# Patient Record
Sex: Female | Born: 1985 | Race: White | Hispanic: No | Marital: Married | Smoking: Current every day smoker
Health system: Southern US, Community
[De-identification: ages and names within clinical notes are randomized; demographics above are authoritative.]

## PROBLEM LIST (undated history)

## (undated) ENCOUNTER — Inpatient Hospital Stay (HOSPITAL_COMMUNITY): Payer: Self-pay

## (undated) DIAGNOSIS — O26899 Other specified pregnancy related conditions, unspecified trimester: Secondary | ICD-10-CM

## (undated) DIAGNOSIS — R12 Heartburn: Secondary | ICD-10-CM

## (undated) DIAGNOSIS — F419 Anxiety disorder, unspecified: Secondary | ICD-10-CM

## (undated) DIAGNOSIS — A630 Anogenital (venereal) warts: Secondary | ICD-10-CM

## (undated) DIAGNOSIS — E079 Disorder of thyroid, unspecified: Secondary | ICD-10-CM

## (undated) DIAGNOSIS — R102 Pelvic and perineal pain: Secondary | ICD-10-CM

## (undated) DIAGNOSIS — N390 Urinary tract infection, site not specified: Secondary | ICD-10-CM

## (undated) DIAGNOSIS — N83201 Unspecified ovarian cyst, right side: Secondary | ICD-10-CM

## (undated) DIAGNOSIS — E039 Hypothyroidism, unspecified: Secondary | ICD-10-CM

## (undated) HISTORY — DX: Pelvic and perineal pain: R10.2

## (undated) HISTORY — DX: Unspecified ovarian cyst, right side: N83.201

## (undated) HISTORY — DX: Disorder of thyroid, unspecified: E07.9

## (undated) HISTORY — PX: TOOTH EXTRACTION: SUR596

## (undated) HISTORY — DX: Urinary tract infection, site not specified: N39.0

---

## 2014-04-02 ENCOUNTER — Encounter: Payer: Self-pay | Admitting: Adult Health

## 2014-04-02 ENCOUNTER — Ambulatory Visit (INDEPENDENT_AMBULATORY_CARE_PROVIDER_SITE_OTHER): Payer: BC Managed Care – PPO | Admitting: Adult Health

## 2014-04-02 VITALS — BP 100/64 | Ht <= 58 in | Wt 135.0 lb

## 2014-04-02 DIAGNOSIS — Z3200 Encounter for pregnancy test, result unknown: Secondary | ICD-10-CM

## 2014-04-02 DIAGNOSIS — Z3201 Encounter for pregnancy test, result positive: Secondary | ICD-10-CM

## 2014-04-02 LAB — POCT URINE PREGNANCY: Preg Test, Ur: POSITIVE

## 2014-04-28 LAB — US OB TRANSVAGINAL

## 2014-04-30 ENCOUNTER — Other Ambulatory Visit: Payer: Self-pay | Admitting: Obstetrics and Gynecology

## 2014-04-30 DIAGNOSIS — O3680X0 Pregnancy with inconclusive fetal viability, not applicable or unspecified: Secondary | ICD-10-CM

## 2014-05-03 ENCOUNTER — Ambulatory Visit: Payer: BC Managed Care – PPO

## 2014-05-07 ENCOUNTER — Ambulatory Visit (INDEPENDENT_AMBULATORY_CARE_PROVIDER_SITE_OTHER): Payer: BC Managed Care – PPO | Admitting: Women's Health

## 2014-05-07 ENCOUNTER — Encounter: Payer: Self-pay | Admitting: Women's Health

## 2014-05-07 VITALS — BP 118/60 | Wt 133.5 lb

## 2014-05-07 DIAGNOSIS — Z331 Pregnant state, incidental: Secondary | ICD-10-CM

## 2014-05-07 DIAGNOSIS — Z36 Encounter for antenatal screening of mother: Secondary | ICD-10-CM

## 2014-05-07 DIAGNOSIS — O99280 Endocrine, nutritional and metabolic diseases complicating pregnancy, unspecified trimester: Secondary | ICD-10-CM

## 2014-05-07 DIAGNOSIS — O34219 Maternal care for unspecified type scar from previous cesarean delivery: Secondary | ICD-10-CM

## 2014-05-07 DIAGNOSIS — E039 Hypothyroidism, unspecified: Secondary | ICD-10-CM | POA: Insufficient documentation

## 2014-05-07 DIAGNOSIS — Z3480 Encounter for supervision of other normal pregnancy, unspecified trimester: Secondary | ICD-10-CM

## 2014-05-07 DIAGNOSIS — Z1389 Encounter for screening for other disorder: Secondary | ICD-10-CM

## 2014-05-07 LAB — CBC
HCT: 39.5 % (ref 36.0–46.0)
Hemoglobin: 13.8 g/dL (ref 12.0–15.0)
MCH: 32.3 pg (ref 26.0–34.0)
MCHC: 34.9 g/dL (ref 30.0–36.0)
MCV: 92.5 fL (ref 78.0–100.0)
Platelets: 362 10*3/uL (ref 150–400)
RBC: 4.27 MIL/uL (ref 3.87–5.11)
RDW: 13.1 % (ref 11.5–15.5)
WBC: 9.6 10*3/uL (ref 4.0–10.5)

## 2014-05-07 LAB — POCT URINALYSIS DIPSTICK
Blood, UA: NEGATIVE
Glucose, UA: NEGATIVE
KETONES UA: NEGATIVE
Leukocytes, UA: NEGATIVE
Nitrite, UA: NEGATIVE
PROTEIN UA: NEGATIVE

## 2014-05-07 NOTE — Progress Notes (Signed)
  Subjective:  Chloe [name] is a 28 y.o. G50P1001 Caucasian female at [redacted]w[redacted]d by 9wk u/s, being seen today for her first obstetrical visit.  Her obstetrical history is significant for previous smoker- quit 2wks ago, prev c/s for NRFHR at 5cm w/spontaneous labor, hypothyroidism- on synthroid daily.  Pregnancy history fully reviewed.  Patient reports nausea and declines need for antiemetics. Denies vb, cramping, uti s/s, abnormal/malodorous vag d/c, or vulvovaginal itching/irritation.  BP 118/60  Wt 133 lb 8 oz (60.555 kg)  LMP 03/08/2014  HISTORY: OB History  Gravida Para Term Preterm AB SAB TAB Ectopic Multiple Living  2 1 1       1     # Outcome Date GA Lbr Len/2nd Weight Sex Delivery Anes PTL Lv  2 CUR           1 TRM 01/31/13 [redacted]w[redacted]d  6 lb 1 oz (2.75 kg) F CS   Y     Past Medical History  Diagnosis Date  . Thyroid disease    Past Surgical History  Procedure Laterality Date  . Cesarean section     Family History  Problem Relation Age of Onset  . Diabetes Paternal Grandfather   . Diabetes Paternal Grandmother   . Epilepsy Father   . Breast cancer Mother   . Thyroid disease Mother     Exam   System:     General: Well developed & nourished, no acute distress   Skin: Warm & dry, normal coloration and turgor, no rashes   Neurologic: Alert & oriented, normal mood   Cardiovascular: Regular rate & rhythm   Respiratory: Effort & rate normal, LCTAB, acyanotic   Abdomen: Soft, non tender   Extremities: normal strength, tone   Pelvic Exam:    Perineum: Normal perineum   Vulva: Normal, no lesions   Vagina:  Normal mucosa, normal discharge   Cervix: Normal, bulbous, appears closed   Uterus: Normal size/shape/contour for GA   Thin prep pap smear April 2015 in [place] at [facility]- thinks was normal  FHR: 160 via informal transabdominal u/s   Assessment:   Pregnancy: G2P1001 Patient Active Problem List   Diagnosis Date Noted  . Supervision of other normal pregnancy 05/07/2014     Priority: High  . Hypothyroidism complicating pregnancy 05/07/2014    Priority: High  . Previous cesarean section complicating pregnancy 05/07/2014    Priority: High    [redacted]w[redacted]d G2P1001 New OB visit Previous smoker- now quit Prev c/s for [facility] Hyothyroidism N/V of pregnancy   Plan:  Initial labs drawn including TSH Continue prenatal vitamins Problem list reviewed and updated Reviewed n/v relief measures and warning s/s to report Reviewed recommended weight gain based on pre-gravid BMI Encouraged well-balanced diet Genetic Screening discussed Integrated Screen: requested Cystic fibrosis screening discussed declined Ultrasound discussed; fetal survey: requested Follow up in 3 weeks for 1st it/nt and visit [facility] completed Continue synthroid Offered VBAC, consent given to take home and review, although she is pretty sure she wants a scheduled RLTCS  Marge Duncans CNM, [facility] 05/07/2014 11:25 AM

## 2014-05-07 NOTE — Patient Instructions (Signed)
Nausea & Vomiting  Have saltine crackers or pretzels by your bed and eat a few bites before you raise your head out of bed in the morning  Eat small frequent meals throughout the day instead of large meals  Drink plenty of fluids throughout the day to stay hydrated, just don't drink a lot of fluids with your meals.  This can make your stomach fill up faster making you feel sick  Do not brush your teeth right after you eat  Products with real ginger are good for nausea, like ginger ale and ginger hard candy Make sure it says made with real ginger!  Sucking on sour candy like lemon heads is also good for nausea  If your prenatal vitamins make you nauseated, take them at night so you will sleep through the nausea  If you feel like you need medicine for the nausea & vomiting please let us know  If you are unable to keep any fluids or food down please let us know    Pregnancy - First Trimester During sexual intercourse, millions of sperm go into the vagina. Only 1 sperm will penetrate and fertilize the female egg while it is in the Fallopian tube. One week later, the fertilized egg implants into the wall of the uterus. An embryo begins to develop into a baby. At 6 to 8 weeks, the eyes and face are formed and the heartbeat can be seen on ultrasound. At the end of 12 weeks (first trimester), all the baby's organs are formed. Now that you are pregnant, you will want to do everything you can to have a healthy baby. Two of the most important things are to get good prenatal care and follow your caregiver's instructions. Prenatal care is all the medical care you receive before the baby's birth. It is given to prevent, find, and treat problems during the pregnancy and childbirth. PRENATAL EXAMS  During prenatal visits, your weight, blood pressure, and urine are checked. This is done to make sure you are healthy and progressing normally during the pregnancy.  A pregnant woman should gain 25 to 35 pounds  during the pregnancy. However, if you are overweight or underweight, your caregiver will advise you regarding your weight.  Your caregiver will ask and answer questions for you.  Blood work, cervical cultures, other necessary tests, and a Pap test are done during your prenatal exams. These tests are done to check on your health and the probable health of your baby. Tests are strongly recommended and done for HIV with your permission. This is the virus that causes AIDS. These tests are done because medicines can be given to help prevent your baby from being born with this infection should you have been infected without knowing it. Blood work is also used to find out your blood type, previous infections, and follow your blood levels (hemoglobin).  Low hemoglobin (anemia) is common during pregnancy. Iron and vitamins are given to help prevent this. Later in the pregnancy, blood tests for diabetes will be done along with any other tests if any problems develop.  You may need other tests to make sure you and the baby are doing well. CHANGES DURING THE FIRST TRIMESTER  Your body goes through many changes during pregnancy. They vary from person to person. Talk to your caregiver about changes you notice and are concerned about. Changes can include:  Your menstrual period stops.  The egg and sperm carry the genes that determine what you look like. Genes from you   and your partner are forming a baby. The female genes determine whether the baby is a boy or a girl.  Your body increases in girth and you may feel bloated.  Feeling sick to your stomach (nauseous) and throwing up (vomiting). If the vomiting is uncontrollable, call your caregiver.  Your breasts will begin to enlarge and become tender.  Your nipples may stick out more and become darker.  The need to urinate more. Painful urination may mean you have a bladder infection.  Tiring easily.  Loss of appetite.  Cravings for certain kinds of  food.  At first, you may gain or lose a couple of pounds.  You may have changes in your emotions from day to day (excited to be pregnant or concerned something may go wrong with the pregnancy and baby).  You may have more vivid and strange dreams. HOME CARE INSTRUCTIONS   It is very important to avoid all smoking, alcohol and non-prescribed drugs during your pregnancy. These affect the formation and growth of the baby. Avoid chemicals while pregnant to ensure the delivery of a healthy infant.  Start your prenatal visits by the 12th week of pregnancy. They are usually scheduled monthly at first, then more often in the last 2 months before delivery. Keep your caregiver's appointments. Follow your caregiver's instructions regarding medicine use, blood and lab tests, exercise, and diet.  During pregnancy, you are providing food for you and your baby. Eat regular, well-balanced meals. Choose foods such as meat, fish, milk and other low fat dairy products, vegetables, fruits, and whole-grain breads and cereals. Your caregiver will tell you of the ideal weight gain.  You can help morning sickness by keeping soda crackers at the bedside. Eat a couple before arising in the morning. You may want to use the crackers without salt on them.  Eating 4 to 5 small meals rather than 3 large meals a day also may help the nausea and vomiting.  Drinking liquids between meals instead of during meals also seems to help nausea and vomiting.  A physical sexual relationship may be continued throughout pregnancy if there are no other problems. Problems may be early (premature) leaking of amniotic fluid from the membranes, vaginal bleeding, or belly (abdominal) pain.  Exercise regularly if there are no restrictions. Check with your caregiver or physical therapist if you are unsure of the safety of some of your exercises. Greater weight gain will occur in the last 2 trimesters of pregnancy. Exercising will  help:  Control your weight.  Keep you in shape.  Prepare you for labor and delivery.  Help you lose your pregnancy weight after you deliver your baby.  Wear a good support or jogging bra for breast tenderness during pregnancy. This may help if worn during sleep too.  Ask when prenatal classes are available. Begin classes when they are offered.  Do not use hot tubs, steam rooms, or saunas.  Wear your seat belt when driving. This protects you and your baby if you are in an accident.  Avoid raw meat, uncooked cheese, cat litter boxes, and soil used by cats throughout the pregnancy. These carry germs that can cause birth defects in the baby.  The first trimester is a good time to visit your dentist for your dental health. Getting your teeth cleaned is okay. Use a softer toothbrush and brush gently during pregnancy.  Ask for help if you have financial, counseling, or nutritional needs during pregnancy. Your caregiver will be able to offer counseling for   these needs as well as refer you for other special needs.  Do not take any medicines or herbs unless told by your caregiver.  Inform your caregiver if there is any mental or physical domestic violence.  Make a list of emergency phone numbers of family, friends, hospital, and police and fire departments.  Write down your questions. Take them to your prenatal visit.  Do not douche.  Do not cross your legs.  If you have to stand for long periods of time, rotate you feet or take small steps in a circle.  You may have more vaginal secretions that may require a sanitary pad. Do not use tampons or scented sanitary pads. MEDICINES AND DRUG USE IN PREGNANCY  Take prenatal vitamins as directed. The vitamin should contain 1 milligram of folic acid. Keep all vitamins out of reach of children. Only a couple vitamins or tablets containing iron may be fatal to a baby or young child when ingested.  Avoid use of all medicines, including herbs,  over-the-counter medicines, not prescribed or suggested by your caregiver. Only take over-the-counter or prescription medicines for pain, discomfort, or fever as directed by your caregiver. Do not use aspirin, ibuprofen, or naproxen unless directed by your caregiver.  Let your caregiver also know about herbs you may be using.  Alcohol is related to a number of birth defects. This includes fetal alcohol syndrome. All alcohol, in any form, should be avoided completely. Smoking will cause low birth rate and premature babies.  Street or illegal drugs are very harmful to the baby. They are absolutely forbidden. A baby born to an addicted mother will be addicted at birth. The baby will go through the same withdrawal an adult does.  Let your caregiver know about any medicines that you have to take and for what reason you take them. SEEK MEDICAL CARE IF:  You have any concerns or worries during your pregnancy. It is better to call with your questions if you feel they cannot wait, rather than worry about them. SEEK IMMEDIATE MEDICAL CARE IF:   An unexplained oral temperature above 102 F (38.9 C) develops, or as your caregiver suggests.  You have leaking of fluid from the vagina (birth canal). If leaking membranes are suspected, take your temperature and inform your caregiver of this when you call.  There is vaginal spotting or bleeding. Notify your caregiver of the amount and how many pads are used.  You develop a bad smelling vaginal discharge with a change in the color.  You continue to feel sick to your stomach (nauseated) and have no relief from remedies suggested. You vomit blood or coffee ground-like materials.  You lose more than 2 pounds of weight in 1 week.  You gain more than 2 pounds of weight in 1 week and you notice swelling of your face, hands, feet, or legs.  You gain 5 pounds or more in 1 week (even if you do not have swelling of your hands, face, legs, or feet).  You get  exposed to German measles and have never had them.  You are exposed to fifth disease or chickenpox.  You develop belly (abdominal) pain. Round ligament discomfort is a common non-cancerous (benign) cause of abdominal pain in pregnancy. Your caregiver still must evaluate this.  You develop headache, fever, diarrhea, pain with urination, or shortness of breath.  You fall or are in a car accident or have any kind of trauma.  There is mental or physical violence in your home. Document   Released: 11/09/2001 Document Revised: 08/09/2012 Document Reviewed: 05/13/2009 ExitCare Patient Information 2014 [employer].  

## 2014-05-08 ENCOUNTER — Encounter: Payer: Self-pay | Admitting: Women's Health

## 2014-05-08 LAB — URINALYSIS
Bilirubin Urine: NEGATIVE
GLUCOSE, UA: NEGATIVE mg/dL
Hgb urine dipstick: NEGATIVE
Ketones, ur: NEGATIVE mg/dL
LEUKOCYTES UA: NEGATIVE
NITRITE: NEGATIVE
PH: 7 (ref 5.0–8.0)
Protein, ur: NEGATIVE mg/dL
Specific Gravity, Urine: 1.01 (ref 1.005–1.030)
Urobilinogen, UA: 0.2 mg/dL (ref 0.0–1.0)

## 2014-05-08 LAB — DRUG SCREEN, URINE, NO CONFIRMATION
Amphetamine Screen, Ur: NEGATIVE
BARBITURATE QUANT UR: NEGATIVE
BENZODIAZEPINES.: NEGATIVE
Cocaine Metabolites: NEGATIVE
Creatinine,U: 104.8 mg/dL
METHADONE: NEGATIVE
Marijuana Metabolite: NEGATIVE
Opiate Screen, Urine: NEGATIVE
Phencyclidine (PCP): NEGATIVE
Propoxyphene: NEGATIVE

## 2014-05-08 LAB — GC/CHLAMYDIA PROBE AMP
CT Probe RNA: NEGATIVE
GC PROBE AMP APTIMA: NEGATIVE

## 2014-05-08 LAB — URINE CULTURE
Colony Count: NO GROWTH
ORGANISM ID, BACTERIA: NO GROWTH

## 2014-05-08 LAB — HEPATITIS B SURFACE ANTIGEN: Hepatitis B Surface Ag: NEGATIVE

## 2014-05-08 LAB — RPR

## 2014-05-08 LAB — HIV ANTIBODY (ROUTINE TESTING W REFLEX): HIV 1&2 Ab, 4th Generation: NONREACTIVE

## 2014-05-08 LAB — RUBELLA SCREEN: RUBELLA: 1.36 {index} — AB (ref <0.90)

## 2014-05-08 LAB — TSH: TSH: 1.538 u[IU]/mL (ref 0.350–4.500)

## 2014-05-08 LAB — VARICELLA ZOSTER ANTIBODY, IGG: VARICELLA IGG: 823.9 {index} — AB (ref <135.00)

## 2014-05-08 LAB — OXYCODONE SCREEN, UA, RFLX CONFIRM: OXYCODONE SCRN UR: NEGATIVE ng/mL

## 2014-05-09 LAB — ABO AND RH: Rh Type: POSITIVE

## 2014-05-09 LAB — ANTIBODY SCREEN: Antibody Screen: NEGATIVE

## 2014-05-28 ENCOUNTER — Encounter: Payer: Self-pay | Admitting: Women's Health

## 2014-05-28 ENCOUNTER — Ambulatory Visit (INDEPENDENT_AMBULATORY_CARE_PROVIDER_SITE_OTHER): Payer: BC Managed Care – PPO

## 2014-05-28 ENCOUNTER — Ambulatory Visit (INDEPENDENT_AMBULATORY_CARE_PROVIDER_SITE_OTHER): Payer: Self-pay | Admitting: Women's Health

## 2014-05-28 VITALS — BP 120/62 | Wt 133.5 lb

## 2014-05-28 DIAGNOSIS — Z36 Encounter for antenatal screening of mother: Secondary | ICD-10-CM

## 2014-05-28 DIAGNOSIS — Z3480 Encounter for supervision of other normal pregnancy, unspecified trimester: Secondary | ICD-10-CM

## 2014-05-28 DIAGNOSIS — Z331 Pregnant state, incidental: Secondary | ICD-10-CM

## 2014-05-28 DIAGNOSIS — Z1389 Encounter for screening for other disorder: Secondary | ICD-10-CM

## 2014-05-28 DIAGNOSIS — Z3481 Encounter for supervision of other normal pregnancy, first trimester: Secondary | ICD-10-CM

## 2014-05-28 DIAGNOSIS — R87610 Atypical squamous cells of undetermined significance on cytologic smear of cervix (ASC-US): Secondary | ICD-10-CM

## 2014-05-28 DIAGNOSIS — R87810 Cervical high risk human papillomavirus (HPV) DNA test positive: Secondary | ICD-10-CM

## 2014-05-28 LAB — POCT URINALYSIS DIPSTICK
Blood, UA: NEGATIVE
GLUCOSE UA: NEGATIVE
Ketones, UA: NEGATIVE
Leukocytes, UA: NEGATIVE
Nitrite, UA: NEGATIVE
Protein, UA: NEGATIVE

## 2014-05-28 NOTE — Patient Instructions (Signed)
Second Trimester of Pregnancy The second trimester is from week 13 through week 28, months 4 through 6. The second trimester is often a time when you feel your best. Your body has also adjusted to being pregnant, and you begin to feel better physically. Usually, morning sickness has lessened or quit completely, you may have more energy, and you may have an increase in appetite. The second trimester is also a time when the fetus is growing rapidly. At the end of the sixth month, the fetus is about 9 inches long and weighs about 1 pounds. You will likely begin to feel the baby move (quickening) between 18 and 20 weeks of the pregnancy. BODY CHANGES Your body goes through many changes during pregnancy. The changes vary from woman to woman.   Your weight will continue to increase. You will notice your lower abdomen bulging out.  You may begin to get stretch marks on your hips, abdomen, and breasts.  You may develop headaches that can be relieved by medicines approved by your health care provider.  You may urinate more often because the fetus is pressing on your bladder.  You may develop or continue to have heartburn as a result of your pregnancy.  You may develop constipation because certain hormones are causing the muscles that push waste through your intestines to slow down.  You may develop hemorrhoids or swollen, bulging veins (varicose veins).  You may have back pain because of the weight gain and pregnancy hormones relaxing your joints between the bones in your pelvis and as a result of a shift in weight and the muscles that support your balance.  Your breasts will continue to grow and be tender.  Your gums may bleed and may be sensitive to brushing and flossing.  Dark spots or blotches (chloasma, mask of pregnancy) may develop on your face. This will likely fade after the baby is born.  A dark line from your belly button to the pubic area (linea nigra) may appear. This will likely fade  after the baby is born.  You may have changes in your hair. These can include thickening of your hair, rapid growth, and changes in texture. Some women also have hair loss during or after pregnancy, or hair that feels dry or thin. Your hair will most likely return to normal after your baby is born. WHAT TO EXPECT AT YOUR PRENATAL VISITS During a routine prenatal visit:  You will be weighed to make sure you and the fetus are growing normally.  Your blood pressure will be taken.  Your abdomen will be measured to track your baby's growth.  The fetal heartbeat will be listened to.  Any test results from the previous visit will be discussed. Your health care provider may ask you:  How you are feeling.  If you are feeling the baby move.  If you have had any abnormal symptoms, such as leaking fluid, bleeding, severe headaches, or abdominal cramping.  If you have any questions. Other tests that may be performed during your second trimester include:  Blood tests that check for:  Low iron levels (anemia).  Gestational diabetes (between 24 and 28 weeks).  Rh antibodies.  Urine tests to check for infections, diabetes, or protein in the urine.  An ultrasound to confirm the proper growth and development of the baby.  An amniocentesis to check for possible genetic problems.  Fetal screens for spina bifida and Down syndrome. HOME CARE INSTRUCTIONS   Avoid all smoking, herbs, alcohol, and unprescribed   drugs. These chemicals affect the formation and growth of the baby.  Follow your health care provider's instructions regarding medicine use. There are medicines that are either safe or unsafe to take during pregnancy.  Exercise only as directed by your health care provider. Experiencing uterine cramps is a good sign to stop exercising.  Continue to eat regular, healthy meals.  Wear a good support bra for breast tenderness.  Do not use hot tubs, steam rooms, or saunas.  Wear your  seat belt at all times when driving.  Avoid raw meat, uncooked cheese, cat litter boxes, and soil used by cats. These carry germs that can cause birth defects in the baby.  Take your prenatal vitamins.  Try taking a stool softener (if your health care provider approves) if you develop constipation. Eat more high-fiber foods, such as fresh vegetables or fruit and whole grains. Drink plenty of fluids to keep your urine clear or pale yellow.  Take warm sitz baths to soothe any pain or discomfort caused by hemorrhoids. Use hemorrhoid cream if your health care provider approves.  If you develop varicose veins, wear support hose. Elevate your feet for 15 minutes, 3-4 times a day. Limit salt in your diet.  Avoid heavy lifting, wear low heel shoes, and practice good posture.  Rest with your legs elevated if you have leg cramps or low back pain.  Visit your dentist if you have not gone yet during your pregnancy. Use a soft toothbrush to brush your teeth and be gentle when you floss.  A sexual relationship may be continued unless your health care provider directs you otherwise.  Continue to go to all your prenatal visits as directed by your health care provider. SEEK MEDICAL CARE IF:   You have dizziness.  You have mild pelvic cramps, pelvic pressure, or nagging pain in the abdominal area.  You have persistent nausea, vomiting, or diarrhea.  You have a bad smelling vaginal discharge.  You have pain with urination. SEEK IMMEDIATE MEDICAL CARE IF:   You have a fever.  You are leaking fluid from your vagina.  You have spotting or bleeding from your vagina.  You have severe abdominal cramping or pain.  You have rapid weight gain or loss.  You have shortness of breath with chest pain.  You notice sudden or extreme swelling of your face, hands, ankles, feet, or legs.  You have not felt your baby move in over an hour.  You have severe headaches that do not go away with  medicine.  You have vision changes. Document Released: 11/09/2001 Document Revised: 11/20/2013 Document Reviewed: 01/16/2013 ExitCare Patient Information 2015 [employer]. This information is not intended to replace advice given to you by your health care provider. Make sure you discuss any questions you have with your health care provider.  

## 2014-05-28 NOTE — Progress Notes (Signed)
U/S(12+6wks)-single active fetus, FHR-155 bpm, CRL c/w dates, posterior Gr 0 placenta, cx appears closed (3.7cm), bilateral adnexa appears WNL, NB present, NT-1.6980mm

## 2014-05-28 NOTE — Progress Notes (Signed)
Low-risk OB appointment G2P1001 702w6d Estimated Date of Delivery: 12/04/14 BP 120/62  Wt 133 lb 8 oz (60.555 kg)  LMP 03/08/2014  BP, weight, and urine reviewed.  Refer to obstetrical flow sheet for FH & FHR.  Denies cramping, lof, vb, or uti s/s. No complaints. Reviewed today's nt u/s, warning s/s to report. Plan:  Continue routine obstetrical care  F/U in 4wks for 2nd IT, OB appointment & COLPO w/ MD d/t ASCUS w/ +HRHPV

## 2014-06-05 LAB — MATERNAL SCREEN, INTEGRATED #1

## 2014-06-25 ENCOUNTER — Encounter: Payer: BC Managed Care – PPO | Admitting: Obstetrics & Gynecology

## 2014-06-27 ENCOUNTER — Encounter: Payer: Self-pay | Admitting: Obstetrics & Gynecology

## 2014-06-27 ENCOUNTER — Ambulatory Visit (INDEPENDENT_AMBULATORY_CARE_PROVIDER_SITE_OTHER): Payer: Self-pay | Admitting: Obstetrics & Gynecology

## 2014-06-27 VITALS — BP 110/70 | Wt 134.0 lb

## 2014-06-27 DIAGNOSIS — Z331 Pregnant state, incidental: Secondary | ICD-10-CM

## 2014-06-27 DIAGNOSIS — Z3480 Encounter for supervision of other normal pregnancy, unspecified trimester: Secondary | ICD-10-CM

## 2014-06-27 DIAGNOSIS — R87810 Cervical high risk human papillomavirus (HPV) DNA test positive: Secondary | ICD-10-CM

## 2014-06-27 DIAGNOSIS — O34219 Maternal care for unspecified type scar from previous cesarean delivery: Secondary | ICD-10-CM

## 2014-06-27 DIAGNOSIS — R87610 Atypical squamous cells of undetermined significance on cytologic smear of cervix (ASC-US): Secondary | ICD-10-CM

## 2014-06-27 DIAGNOSIS — Z3482 Encounter for supervision of other normal pregnancy, second trimester: Secondary | ICD-10-CM

## 2014-06-27 DIAGNOSIS — Z1389 Encounter for screening for other disorder: Secondary | ICD-10-CM

## 2014-06-27 LAB — POCT URINALYSIS DIPSTICK
Blood, UA: NEGATIVE
GLUCOSE UA: NEGATIVE
KETONES UA: NEGATIVE
LEUKOCYTES UA: NEGATIVE
Nitrite, UA: NEGATIVE
Protein, UA: NEGATIVE

## 2014-06-27 NOTE — Progress Notes (Signed)
G2P1001 5874w1d Estimated Date of Delivery: 12/04/14  Blood pressure 110/70, weight 134 lb (60.782 kg), last menstrual period 03/08/2014, not currently breastfeeding.   BP weight and urine results all reviewed and noted.  Please refer to the obstetrical flow sheet for the fundal height and fetal heart rate documentation:  Patient reports good fetal movement, denies any bleeding and no rupture of membranes symptoms or regular contractions. Patient is without complaints. All questions were answered.  Plan:  Continued routine obstetrical care,   Follow up in 4 weeks for OB appointment, sonogram     Colposcopy Procedure Note:  Pap performed:  04/2014 Result:  ASCUS +HPV Smoker:  No. New sexual partner:  No.  : time frame:    History of abnormal Pap: no  Due to the indications listed above, a thorough colposcopic evaluation of the cervix and upper vagina was performed in the usual fashion using 3% acetic acid.  The findings of the visual inspection are noted below:  adequate Acetowhite changes       positive Punctation                      negative Mosaicism                      negative Abnormal Vessels          negative  Biopsy performed:          No.  Complications: no   Impression: HPV atypia  Recommendation: Follow up Colposcopy 8 weeks post delivery      Past Medical History  Diagnosis Date  . Thyroid disease     Past Surgical History  Procedure Laterality Date  . Cesarean section      OB History   Grav Para Term Preterm Abortions TAB SAB Ect Mult Living   2 1 1       1       Allergies  Allergen Reactions  . Amoxicillin Nausea And Vomiting  . Cephalosporins Hives  . Penicillins Hives    History   Social History  . Marital Status: Married    Spouse Name: N/A    Number of Children: N/A  . Years of Education: N/A   Social History Main Topics  . Smoking status: Former Smoker -- 0.50 packs/day for 8 years    Types: Cigarettes  . Smokeless  tobacco: Never Used  . Alcohol Use: No     Comment: occ.; not now  . Drug Use: No  . Sexual Activity: Yes    Birth Control/ Protection: None   Other Topics Concern  . None   Social History Narrative  . None    Family History  Problem Relation Age of Onset  . Diabetes Paternal Grandfather   . Diabetes Paternal Grandmother   . Epilepsy Father   . Breast cancer Mother   . Thyroid disease Mother

## 2014-06-27 NOTE — Addendum Note (Signed)
Addended by: Richardson ChiquitoRAVIS, Lenora Gomes M on: 06/27/2014 11:36 AM   Modules accepted: Orders

## 2014-07-02 LAB — MATERNAL SCREEN, INTEGRATED #2
AFP MOM MAT SCREEN: 0.85
AFP, Serum: 34.2 ng/mL
Calculated Gestational Age: 17
Crown Rump Length: 64.5 mm
ESTRIOL FREE MAT SCREEN: 1.17 ng/mL
ESTRIOL MOM MAT SCREEN: 1.05
INHIBIN A DIMERIC MAT SCREEN: 92 pg/mL
Inhibin A MoM: 0.52
MSS Trisomy 18 Risk: <1:5000 {titer}
NT MoM: 1.23
NUCHAL TRANSLUCENCY MAT SCREEN 2: 1.8 mm
NUMBER OF FETUSES MAT SCREEN 2: 1
PAPP-A MAT SCREEN: 418 ng/mL
PAPP-A MoM: 0.53
hCG MoM: 0.58
hCG, Serum: 17.8 IU/mL

## 2014-07-25 ENCOUNTER — Other Ambulatory Visit: Payer: BC Managed Care – PPO

## 2014-07-25 ENCOUNTER — Ambulatory Visit (INDEPENDENT_AMBULATORY_CARE_PROVIDER_SITE_OTHER): Payer: BC Managed Care – PPO | Admitting: Advanced Practice Midwife

## 2014-07-25 ENCOUNTER — Encounter: Payer: BC Managed Care – PPO | Admitting: Advanced Practice Midwife

## 2014-07-25 ENCOUNTER — Encounter: Payer: Self-pay | Admitting: Advanced Practice Midwife

## 2014-07-25 ENCOUNTER — Other Ambulatory Visit: Payer: Self-pay | Admitting: Obstetrics & Gynecology

## 2014-07-25 ENCOUNTER — Ambulatory Visit (INDEPENDENT_AMBULATORY_CARE_PROVIDER_SITE_OTHER): Payer: BC Managed Care – PPO

## 2014-07-25 VITALS — BP 106/66 | Wt 138.0 lb

## 2014-07-25 DIAGNOSIS — Z1389 Encounter for screening for other disorder: Secondary | ICD-10-CM

## 2014-07-25 DIAGNOSIS — Z3482 Encounter for supervision of other normal pregnancy, second trimester: Secondary | ICD-10-CM

## 2014-07-25 DIAGNOSIS — Z3480 Encounter for supervision of other normal pregnancy, unspecified trimester: Secondary | ICD-10-CM

## 2014-07-25 DIAGNOSIS — O34219 Maternal care for unspecified type scar from previous cesarean delivery: Secondary | ICD-10-CM

## 2014-07-25 DIAGNOSIS — Z331 Pregnant state, incidental: Secondary | ICD-10-CM

## 2014-07-25 LAB — POCT URINALYSIS DIPSTICK
GLUCOSE UA: NEGATIVE
Ketones, UA: NEGATIVE
Leukocytes, UA: NEGATIVE
Nitrite, UA: NEGATIVE
Protein, UA: NEGATIVE
RBC UA: NEGATIVE

## 2014-07-25 NOTE — Progress Notes (Signed)
Pt denies any problems or concerns at this time.  

## 2014-07-25 NOTE — Progress Notes (Signed)
[id number] [redacted]w[redacted]d Estimated Date of Delivery: 12/04/14  Blood pressure 106/66, weight 138 lb (62.596 kg), last menstrual period 03/08/2014, not currently breastfeeding.   BP weight and urine results all reviewed and noted.  Please refer to the obstetrical flow sheet for the fundal height and fetal heart rate documentation: had anatomy scan today:  Normal female  Patient reports good fetal movement, denies any bleeding and no rupture of membranes symptoms or regular contractions. Patient is without complaints. All questions were answered.  Plan:  Continued routine obstetrical care,   Follow up in 4 weeks for OB appointment,

## 2014-07-25 NOTE — Progress Notes (Signed)
U/S(21+1wks)-single active fetus, meas c/w dates, fluid wnl, posterior/fundal Gr 0 placenta, cx appears closed (3.9cm), bilateral adnexa appears WNL, FHR-159 bpm, female fetus, no major abnl noted

## 2014-08-22 ENCOUNTER — Ambulatory Visit (INDEPENDENT_AMBULATORY_CARE_PROVIDER_SITE_OTHER): Payer: BC Managed Care – PPO | Admitting: Advanced Practice Midwife

## 2014-08-22 VITALS — BP 128/80 | Wt 142.0 lb

## 2014-08-22 DIAGNOSIS — Z1389 Encounter for screening for other disorder: Secondary | ICD-10-CM

## 2014-08-22 DIAGNOSIS — R87610 Atypical squamous cells of undetermined significance on cytologic smear of cervix (ASC-US): Secondary | ICD-10-CM

## 2014-08-22 DIAGNOSIS — R87810 Cervical high risk human papillomavirus (HPV) DNA test positive: Secondary | ICD-10-CM

## 2014-08-22 DIAGNOSIS — Z331 Pregnant state, incidental: Secondary | ICD-10-CM

## 2014-08-22 DIAGNOSIS — Z3480 Encounter for supervision of other normal pregnancy, unspecified trimester: Secondary | ICD-10-CM

## 2014-08-22 DIAGNOSIS — Z3482 Encounter for supervision of other normal pregnancy, second trimester: Secondary | ICD-10-CM

## 2014-08-22 LAB — POCT URINALYSIS DIPSTICK
Blood, UA: NEGATIVE
GLUCOSE UA: NEGATIVE
KETONES UA: NEGATIVE
Leukocytes, UA: NEGATIVE
Nitrite, UA: NEGATIVE
PROTEIN UA: NEGATIVE

## 2014-08-22 NOTE — Patient Instructions (Signed)
1. Before your test, do not eat or drink anything for 8-10 hours prior to your  appointment (a small amount of water is allowed and you may take any medicines you normally take). Be sure to drink lots of water the day before. 2. When you arrive, your blood will be drawn for a 'fasting' blood sugar level.  Then you will be given a sweetened carbonated beverage to drink. You should  complete drinking this beverage within five minutes. After finishing the  beverage, you will have your blood drawn exactly 1 and 2 hours later. Having  your blood drawn on time is an important part of this test. A total of three blood  samples will be done. 3. The test takes approximately 2  hours. During the test, do not have anything to  eat or drink. Do not smoke, chew gum (not even sugarless gum) or use breath mints.  4. During the test you should remain close by and seated as much as possible and  avoid walking around. You may want to bring a book or something else to  occupy your time.  5. After your test, you may eat and drink as normal. You may want to bring a snack  to eat after the test is finished. Your provider will advise you as to the results of  this test and any follow-up if necessary  You will also be retested for syphilis, HIV and blood levels (anemia):  You were already tested in the first trimester, but [facility] recommends retesting.  Additionally, you will be tested for Type 2 Herpes. MOST people do not know that they have genital herpes, as only around 15% of people have outbreaks.  However, it is still transmittable to other people, including the baby (but only during the birth).  If you test positive for Type 2 Herpes, we place you on a medicine called acyclovir the last 6 weeks of your pregnancy to prevent transmission of the virus to the baby during the birth.    If your sugar test is positive for gestational diabetes, you will be given an phone call and further instructions discussed.   We typically do not call patients with positive herpes results, but will discuss it at your next appointment.  If you wish to know all of your test results before your next appointment, feel free to call the office, or look up your test results on Mychart.  (The range that the lab uses for normal values of the sugar test are not necessarily the range that is used for pregnant women; if your results are within the range, they are definitely normal.  However, if a value is deemed "high" by the lab, it may not be too high for a pregnant woman.  We will need to discuss the normal range if your value(s) fall in the "high" category).     Sometime between 27 and 36 weeks, it is recommended that you and anyone who is going to be in close contact with your baby receive the Tdap booster.  You should receive it EACH pregnancy, regardless of when your last booster was.  You may go to the [facility] (no appointment necessary) or your Primary Care office to receive the vaccine.  If you do not receive the vaccine prior to delivery, it will be offered in the hospital.  However, if you get it at least 2 weeks prior to delivery, you will have the added advantage of passing the immunity to your baby.   

## 2014-08-22 NOTE — Progress Notes (Signed)
G2P1001 [redacted]w[redacted]d Estimated Date of Delivery: 12/04/14  Last menstrual period 03/08/2014, not currently breastfeeding.   BP weight and urine results all reviewed and noted.  Please refer to the obstetrical flow sheet for the fundal height and fetal heart rate documentation:  Patient reports good fetal movement, denies any bleeding and no rupture of membranes symptoms or regular contractions. Patient is without complaints other than normal pregnancy complaints All questions were answered.  Plan:  Continued routine obstetrical care,   Follow up in 2 weeks for OB appointment, PN2, TSH

## 2014-09-06 ENCOUNTER — Other Ambulatory Visit: Payer: BC Managed Care – PPO

## 2014-09-06 ENCOUNTER — Encounter: Payer: BC Managed Care – PPO | Admitting: Obstetrics & Gynecology

## 2014-09-10 ENCOUNTER — Encounter: Payer: BC Managed Care – PPO | Admitting: Obstetrics & Gynecology

## 2014-09-12 ENCOUNTER — Ambulatory Visit (INDEPENDENT_AMBULATORY_CARE_PROVIDER_SITE_OTHER): Payer: BC Managed Care – PPO | Admitting: Obstetrics & Gynecology

## 2014-09-12 ENCOUNTER — Other Ambulatory Visit: Payer: BC Managed Care – PPO

## 2014-09-12 ENCOUNTER — Encounter: Payer: Self-pay | Admitting: Obstetrics & Gynecology

## 2014-09-12 VITALS — BP 112/60 | Wt 145.0 lb

## 2014-09-12 DIAGNOSIS — Z331 Pregnant state, incidental: Secondary | ICD-10-CM

## 2014-09-12 DIAGNOSIS — Z113 Encounter for screening for infections with a predominantly sexual mode of transmission: Secondary | ICD-10-CM

## 2014-09-12 DIAGNOSIS — Z3492 Encounter for supervision of normal pregnancy, unspecified, second trimester: Secondary | ICD-10-CM

## 2014-09-12 DIAGNOSIS — Z0184 Encounter for antibody response examination: Secondary | ICD-10-CM

## 2014-09-12 DIAGNOSIS — Z1389 Encounter for screening for other disorder: Secondary | ICD-10-CM

## 2014-09-12 DIAGNOSIS — Z114 Encounter for screening for human immunodeficiency virus [HIV]: Secondary | ICD-10-CM

## 2014-09-12 DIAGNOSIS — Z3482 Encounter for supervision of other normal pregnancy, second trimester: Secondary | ICD-10-CM

## 2014-09-12 DIAGNOSIS — Z131 Encounter for screening for diabetes mellitus: Secondary | ICD-10-CM

## 2014-09-12 LAB — POCT URINALYSIS DIPSTICK
Glucose, UA: NEGATIVE
Ketones, UA: NEGATIVE
Leukocytes, UA: NEGATIVE
Nitrite, UA: NEGATIVE
PROTEIN UA: NEGATIVE
RBC UA: NEGATIVE

## 2014-09-12 LAB — CBC
HEMATOCRIT: 32.9 % — AB (ref 36.0–46.0)
Hemoglobin: 11.6 g/dL — ABNORMAL LOW (ref 12.0–15.0)
MCH: 33.4 pg (ref 26.0–34.0)
MCHC: 35.3 g/dL (ref 30.0–36.0)
MCV: 94.8 fL (ref 78.0–100.0)
Platelets: 295 10*3/uL (ref 150–400)
RBC: 3.47 MIL/uL — AB (ref 3.87–5.11)
RDW: 13.4 % (ref 11.5–15.5)
WBC: 9.3 10*3/uL (ref 4.0–10.5)

## 2014-09-12 NOTE — Progress Notes (Signed)
G2P1001 1566w1d Estimated Date of Delivery: 12/04/14  Blood pressure 112/60, weight 145 lb (65.772 kg), last menstrual period 03/08/2014, not currently breastfeeding.   BP weight and urine results all reviewed and noted.  Please refer to the obstetrical flow sheet for the fundal height and fetal heart rate documentation:  Patient reports good fetal movement, denies any bleeding and no rupture of membranes symptoms or regular contractions. Patient is without complaints. All questions were answered.  Plan:  Continued routine obstetrical care,   Follow up in 3 weeks for OB appointment, routine

## 2014-09-13 LAB — GLUCOSE TOLERANCE, 2 HOURS W/ 1HR
GLUCOSE, FASTING: 81 mg/dL (ref 70–99)
Glucose, 1 hour: 100 mg/dL (ref 70–170)
Glucose, 2 hour: 82 mg/dL (ref 70–139)

## 2014-09-13 LAB — HSV 2 ANTIBODY, IGG

## 2014-09-13 LAB — RPR

## 2014-09-13 LAB — HIV ANTIBODY (ROUTINE TESTING W REFLEX): HIV 1&2 Ab, 4th Generation: NONREACTIVE

## 2014-09-13 LAB — ANTIBODY SCREEN: ANTIBODY SCREEN: NEGATIVE

## 2014-09-25 ENCOUNTER — Encounter: Payer: Self-pay | Admitting: Obstetrics & Gynecology

## 2014-09-30 ENCOUNTER — Encounter: Payer: Self-pay | Admitting: Obstetrics & Gynecology

## 2014-10-03 ENCOUNTER — Encounter: Payer: BC Managed Care – PPO | Admitting: Obstetrics & Gynecology

## 2014-10-07 ENCOUNTER — Encounter: Payer: Self-pay | Admitting: Obstetrics & Gynecology

## 2014-10-07 ENCOUNTER — Ambulatory Visit (INDEPENDENT_AMBULATORY_CARE_PROVIDER_SITE_OTHER): Payer: BC Managed Care – PPO | Admitting: Obstetrics & Gynecology

## 2014-10-07 VITALS — BP 120/80 | Wt 148.0 lb

## 2014-10-07 DIAGNOSIS — Z331 Pregnant state, incidental: Secondary | ICD-10-CM

## 2014-10-07 DIAGNOSIS — Z3483 Encounter for supervision of other normal pregnancy, third trimester: Secondary | ICD-10-CM

## 2014-10-07 DIAGNOSIS — Z1389 Encounter for screening for other disorder: Secondary | ICD-10-CM

## 2014-10-07 LAB — POCT URINALYSIS DIPSTICK
Glucose, UA: NEGATIVE
Ketones, UA: NEGATIVE
Leukocytes, UA: NEGATIVE
Nitrite, UA: NEGATIVE
RBC UA: NEGATIVE

## 2014-10-07 NOTE — Progress Notes (Signed)
G2P1001 6492w5d Estimated Date of Delivery: 12/04/14  Blood pressure 120/80, weight 148 lb (67.132 kg), last menstrual period 03/08/2014, not currently breastfeeding.   BP weight and urine results all reviewed and noted.  Please refer to the obstetrical flow sheet for the fundal height and fetal heart rate documentation:  Patient reports good fetal movement, denies any bleeding and no rupture of membranes symptoms or regular contractions. Patient is without complaints. All questions were answered.  Plan:  Continued routine obstetrical care,   Follow up in 2 weeks for OB appointment, routine

## 2014-10-21 ENCOUNTER — Encounter: Payer: Self-pay | Admitting: Obstetrics & Gynecology

## 2014-10-21 ENCOUNTER — Ambulatory Visit (INDEPENDENT_AMBULATORY_CARE_PROVIDER_SITE_OTHER): Payer: BC Managed Care – PPO | Admitting: Obstetrics & Gynecology

## 2014-10-21 VITALS — BP 120/80 | Wt 147.4 lb

## 2014-10-21 DIAGNOSIS — Z1389 Encounter for screening for other disorder: Secondary | ICD-10-CM

## 2014-10-21 DIAGNOSIS — Z331 Pregnant state, incidental: Secondary | ICD-10-CM

## 2014-10-21 DIAGNOSIS — Z3483 Encounter for supervision of other normal pregnancy, third trimester: Secondary | ICD-10-CM

## 2014-10-21 LAB — POCT URINALYSIS DIPSTICK
Glucose, UA: NEGATIVE
Nitrite, UA: NEGATIVE
Protein, UA: NEGATIVE
RBC UA: NEGATIVE

## 2014-10-21 NOTE — Progress Notes (Signed)
G2P1001 6518w5d Estimated Date of Delivery: 12/04/14  Blood pressure 120/80, weight 147 lb 6.4 oz (66.86 kg), last menstrual period 03/08/2014, not currently breastfeeding.   BP weight and urine results all reviewed and noted.  Please refer to the obstetrical flow sheet for the fundal height and fetal heart rate documentation:  Patient reports good fetal movement, denies any bleeding and no rupture of membranes symptoms or regular contractions. Patient is without complaints. All questions were answered.  Plan:  Continued routine obstetrical care,   Follow up in 2 weeks for OB appointment, dr Despina Hiddeneure

## 2014-11-08 ENCOUNTER — Ambulatory Visit (INDEPENDENT_AMBULATORY_CARE_PROVIDER_SITE_OTHER): Payer: BC Managed Care – PPO | Admitting: Obstetrics & Gynecology

## 2014-11-08 VITALS — BP 120/70 | Wt 151.0 lb

## 2014-11-08 DIAGNOSIS — Z331 Pregnant state, incidental: Secondary | ICD-10-CM

## 2014-11-08 DIAGNOSIS — Z3685 Encounter for antenatal screening for Streptococcus B: Secondary | ICD-10-CM

## 2014-11-08 DIAGNOSIS — Z113 Encounter for screening for infections with a predominantly sexual mode of transmission: Secondary | ICD-10-CM

## 2014-11-08 DIAGNOSIS — Z3483 Encounter for supervision of other normal pregnancy, third trimester: Secondary | ICD-10-CM

## 2014-11-08 NOTE — Progress Notes (Signed)
G2P1001 2627w2d Estimated Date of Delivery: 12/04/14  Blood pressure 120/70, weight 151 lb (68.493 kg), last menstrual period 03/08/2014, not currently breastfeeding.   BP weight and urine results all reviewed and noted.  Please refer to the obstetrical flow sheet for the fundal height and fetal heart rate documentation:  Patient reports good fetal movement, denies any bleeding and no rupture of membranes symptoms or regular contractions. Patient is without complaints. All questions were answered.  Plan:  Continued routine obstetrical care,   Follow up in 1 weeks for OB appointment, routine

## 2014-11-09 LAB — GC/CHLAMYDIA PROBE AMP
CT Probe RNA: NEGATIVE
GC Probe RNA: NEGATIVE

## 2014-11-10 LAB — CULTURE, BETA STREP (GROUP B ONLY)

## 2014-11-11 ENCOUNTER — Inpatient Hospital Stay (HOSPITAL_COMMUNITY): Payer: BC Managed Care – PPO

## 2014-11-11 ENCOUNTER — Inpatient Hospital Stay (HOSPITAL_COMMUNITY)
Admission: AD | Admit: 2014-11-11 | Discharge: 2014-11-11 | Disposition: A | Discharge status: Home / Self Care | Payer: BC Managed Care – PPO | Source: Ambulatory Visit | Attending: Family Medicine | Admitting: Family Medicine

## 2014-11-11 DIAGNOSIS — R0602 Shortness of breath: Secondary | ICD-10-CM | POA: Insufficient documentation

## 2014-11-11 DIAGNOSIS — Z87891 Personal history of nicotine dependence: Secondary | ICD-10-CM | POA: Insufficient documentation

## 2014-11-11 DIAGNOSIS — R0789 Other chest pain: Secondary | ICD-10-CM | POA: Diagnosis present

## 2014-11-11 DIAGNOSIS — Z3483 Encounter for supervision of other normal pregnancy, third trimester: Secondary | ICD-10-CM

## 2014-11-11 DIAGNOSIS — O9989 Other specified diseases and conditions complicating pregnancy, childbirth and the puerperium: Secondary | ICD-10-CM

## 2014-11-11 DIAGNOSIS — Z3A36 36 weeks gestation of pregnancy: Secondary | ICD-10-CM | POA: Diagnosis not present

## 2014-11-11 DIAGNOSIS — O34219 Maternal care for unspecified type scar from previous cesarean delivery: Secondary | ICD-10-CM

## 2014-11-11 LAB — URINALYSIS, ROUTINE W REFLEX MICROSCOPIC
Bilirubin Urine: NEGATIVE
Glucose, UA: NEGATIVE mg/dL
Ketones, ur: NEGATIVE mg/dL
Nitrite: NEGATIVE
PROTEIN: NEGATIVE mg/dL
Specific Gravity, Urine: 1.01 (ref 1.005–1.030)
Urobilinogen, UA: 0.2 mg/dL (ref 0.0–1.0)
pH: 7 (ref 5.0–8.0)

## 2014-11-11 LAB — CBC WITH DIFFERENTIAL/PLATELET
BASOS ABS: 0 10*3/uL (ref 0.0–0.1)
Basophils Relative: 0 % (ref 0–1)
EOS PCT: 1 % (ref 0–5)
Eosinophils Absolute: 0.1 10*3/uL (ref 0.0–0.7)
HCT: 31.5 % — ABNORMAL LOW (ref 36.0–46.0)
Hemoglobin: 11.2 g/dL — ABNORMAL LOW (ref 12.0–15.0)
Lymphocytes Relative: 9 % — ABNORMAL LOW (ref 12–46)
Lymphs Abs: 1.1 10*3/uL (ref 0.7–4.0)
MCH: 34.9 pg — ABNORMAL HIGH (ref 26.0–34.0)
MCHC: 35.6 g/dL (ref 30.0–36.0)
MCV: 98.1 fL (ref 78.0–100.0)
MONO ABS: 1.1 10*3/uL — AB (ref 0.1–1.0)
Monocytes Relative: 9 % (ref 3–12)
Neutro Abs: 9.8 10*3/uL — ABNORMAL HIGH (ref 1.7–7.7)
Neutrophils Relative %: 81 % — ABNORMAL HIGH (ref 43–77)
PLATELETS: 233 10*3/uL (ref 150–400)
RBC: 3.21 MIL/uL — ABNORMAL LOW (ref 3.87–5.11)
RDW: 13.7 % (ref 11.5–15.5)
WBC: 12.1 10*3/uL — ABNORMAL HIGH (ref 4.0–10.5)

## 2014-11-11 LAB — URINE MICROSCOPIC-ADD ON

## 2014-11-11 MED ORDER — LACTATED RINGERS IV BOLUS (SEPSIS)
1000.0000 mL | Freq: Once | INTRAVENOUS | Status: AC
  Administered 2014-11-11: 1000 mL via INTRAVENOUS

## 2014-11-11 NOTE — MAU Note (Signed)
Patient presents at [redacted] weeks pregnant complaining of difficulty breathing, tightness in chest and sinusitis since Saturday.

## 2014-11-11 NOTE — MAU Provider Note (Signed)
History     CSN: [id number]  Arrival date and time: 11/11/14 1450   None     Chief Complaint  Patient presents with  . Nasal Congestion    hard to breathe  . Chest Pain    tightness in chest  . Sinusitis   HPI  November A Rudzinski is a 28 y.o. G2P1001 at 7099w5d who presents to the MAU for evaluation of chest tightness and shortness of breath. Was in her usual state of health until over the weekend when she developed chills, general malaise and upper respiratory symptoms. No documented fevers. Symptoms progressively worsened and today developed central chest tightness and difficulty breathing. No sick contacts. Has been taking tylenol for body aches.   OB History    Gravida Para Term Preterm AB TAB SAB Ectopic Multiple Living   2 1 1       1       Past Medical History  Diagnosis Date  . Thyroid disease     Past Surgical History  Procedure Laterality Date  . Cesarean section      Family History  Problem Relation Age of Onset  . Diabetes Paternal Grandfather   . Diabetes Paternal Grandmother   . Epilepsy Father   . Breast cancer Mother   . Thyroid disease Mother     History  Substance Use Topics  . Smoking status: Former Smoker -- 0.50 packs/day for 8 years    Types: Cigarettes  . Smokeless tobacco: Never Used  . Alcohol Use: No     Comment: occ.; not now    Allergies:  Allergies  Allergen Reactions  . Amoxicillin Nausea And Vomiting  . Cephalosporins Hives  . Penicillins Hives    Prescriptions prior to admission  Medication Sig Dispense Refill Last Dose  . acetaminophen (TYLENOL) 500 MG tablet Take 500 mg by mouth as needed.   Taking  . clindamycin (CLEOCIN) 150 MG capsule Take by mouth 3 (three) times daily.   Not Taking  . levothyroxine (SYNTHROID, LEVOTHROID) 75 MCG tablet Take 75 mcg by mouth daily before breakfast.   Taking  . Prenatal Vit-Fe Fumarate-FA (PRENATAL VITAMIN PO) Take by mouth daily.   Taking    Review of Systems  Constitutional:  Positive for chills and malaise/fatigue. Negative for fever.  HENT: Positive for congestion. Negative for sore throat.   Eyes: Negative.  Negative for double vision and photophobia.  Respiratory: Positive for cough and shortness of breath. Negative for wheezing.   Cardiovascular: Negative.  Negative for chest pain and leg swelling.  Gastrointestinal: Negative.  Negative for nausea, vomiting, abdominal pain, diarrhea and constipation.  Genitourinary: Negative.  Negative for dysuria, urgency, frequency and hematuria.  Musculoskeletal: Positive for myalgias.  Skin: Negative.   Neurological: Negative.  Negative for weakness and headaches.  Psychiatric/Behavioral: Negative.   All other systems reviewed and are negative.  Physical Exam   Blood pressure 120/70, pulse 107, temperature 98.3 F (36.8 C), temperature source Oral, resp. rate 18, height 4\' 8"  (1.422 m), weight 149 lb 8 oz (67.813 kg), last menstrual period 03/08/2014, SpO2 98 %, not currently breastfeeding.  Physical Exam  Nursing note and vitals reviewed. Constitutional: She is oriented to person, place, and time. She appears well-developed and well-nourished. No distress.  HENT:  Head: Normocephalic and atraumatic.  Cardiovascular: Regular rhythm, S1 normal, S2 normal and intact distal pulses.  Tachycardia present.   No murmur heard. Respiratory: Effort normal and breath sounds normal. No accessory muscle usage. No respiratory distress. She  has no wheezes. She has no rhonchi. She has no rales. She exhibits no tenderness.  GI: Soft.  Gravid  Neurological: She is alert and oriented to person, place, and time.  Skin: Skin is warm and dry.  Psychiatric: She has a normal mood and affect. Her behavior is normal.    MAU Course  Procedures  MDM 1L LR bolus  CBC  Lab Results  Component Value Date   WBC 12.1* 11/11/2014   HGB 11.2* 11/11/2014   HCT 31.5* 11/11/2014   MCV 98.1 11/11/2014   PLT 233 11/11/2014   Flu swab  pending  Chest Xray  FINDINGS: The heart size and mediastinal contours are within normal limits. Both lungs are clear. The visualized skeletal structures are unremarkable. IMPRESSION: No active cardiopulmonary disease.  NST (pre fluid bolus): 165/mod/+A/-D NST (post fluid bolus): 135/mod/+A/-D Toco: regular every 3 minutes  SVE: cl/th/hi  Assessment and Plan  Dawnielle A Shaw is a 28 y.o. G2P1001 at 7253w5d who presents to the MAU for evaluation of chest tightness and shortness of breath.   # URI symptoms/Shortness of Breath - Symptoms consistent with upper respiratory infection, likely viral in etiology - Non toxic appearing. Afebrile. Initially tachycardic, improved with fluid bolus.  - CXR negative - Respiratory viral panel pending - Supportive care - Follow up in clinic within 1 week or sooner if symptoms worsen  # Contractions - No cervical change while monitored in MAU  - Not in labor - FM/PTL precautions given  William DaltonMcEachern, Arita Severtson 11/11/2014, 4:47 PM

## 2014-11-11 NOTE — Discharge Instructions (Signed)
Third Trimester of Pregnancy The third trimester is from week 29 through week 42, months 7 through 9. The third trimester is a time when the fetus is growing rapidly. At the end of the ninth month, the fetus is about 20 inches in length and weighs 6-10 pounds.  BODY CHANGES Your body goes through many changes during pregnancy. The changes vary from woman to woman.   Your weight will continue to increase. You can expect to gain 25-35 pounds (11-16 kg) by the end of the pregnancy.  You may begin to get stretch marks on your hips, abdomen, and breasts.  You may urinate more often because the fetus is moving lower into your pelvis and pressing on your bladder.  You may develop or continue to have heartburn as a result of your pregnancy.  You may develop constipation because certain hormones are causing the muscles that push waste through your intestines to slow down.  You may develop hemorrhoids or swollen, bulging veins (varicose veins).  You may have pelvic pain because of the weight gain and pregnancy hormones relaxing your joints between the bones in your pelvis. Backaches may result from overexertion of the muscles supporting your posture.  You may have changes in your hair. These can include thickening of your hair, rapid growth, and changes in texture. Some women also have hair loss during or after pregnancy, or hair that feels dry or thin. Your hair will most likely return to normal after your baby is born.  Your breasts will continue to grow and be tender. A yellow discharge may leak from your breasts called colostrum.  Your belly button may stick out.  You may feel short of breath because of your expanding uterus.  You may notice the fetus "dropping," or moving lower in your abdomen.  You may have a bloody mucus discharge. This usually occurs a few days to a week before labor begins.  Your cervix becomes thin and soft (effaced) near your due date. WHAT TO EXPECT AT YOUR PRENATAL  EXAMS  You will have prenatal exams every 2 weeks until week 36. Then, you will have weekly prenatal exams. During a routine prenatal visit:  You will be weighed to make sure you and the fetus are growing normally.  Your blood pressure is taken.  Your abdomen will be measured to track your baby's growth.  The fetal heartbeat will be listened to.  Any test results from the previous visit will be discussed.  You may have a cervical check near your due date to see if you have effaced. At around 36 weeks, your caregiver will check your cervix. At the same time, your caregiver will also perform a test on the secretions of the vaginal tissue. This test is to determine if a type of bacteria, Group B streptococcus, is present. Your caregiver will explain this further. Your caregiver may ask you:  What your birth plan is.  How you are feeling.  If you are feeling the baby move.  If you have had any abnormal symptoms, such as leaking fluid, bleeding, severe headaches, or abdominal cramping.  If you have any questions. Other tests or screenings that may be performed during your third trimester include:  Blood tests that check for low iron levels (anemia).  Fetal testing to check the health, activity level, and growth of the fetus. Testing is done if you have certain medical conditions or if there are problems during the pregnancy. FALSE LABOR You may feel small, irregular contractions that   eventually go away. These are called Braxton Hicks contractions, or false labor. Contractions may last for hours, days, or even weeks before true labor sets in. If contractions come at regular intervals, intensify, or become painful, it is best to be seen by your caregiver.  SIGNS OF LABOR   Menstrual-like cramps.  Contractions that are 5 minutes apart or less.  Contractions that start on the top of the uterus and spread down to the lower abdomen and back.  A sense of increased pelvic pressure or back  pain.  A watery or bloody mucus discharge that comes from the vagina. If you have any of these signs before the 37th week of pregnancy, call your caregiver right away. You need to go to the hospital to get checked immediately. HOME CARE INSTRUCTIONS   Avoid all smoking, herbs, alcohol, and unprescribed drugs. These chemicals affect the formation and growth of the baby.  Follow your caregiver's instructions regarding medicine use. There are medicines that are either safe or unsafe to take during pregnancy.  Exercise only as directed by your caregiver. Experiencing uterine cramps is a good sign to stop exercising.  Continue to eat regular, healthy meals.  Wear a good support bra for breast tenderness.  Do not use hot tubs, steam rooms, or saunas.  Wear your seat belt at all times when driving.  Avoid raw meat, uncooked cheese, cat litter boxes, and soil used by cats. These carry germs that can cause birth defects in the baby.  Take your prenatal vitamins.  Try taking a stool softener (if your caregiver approves) if you develop constipation. Eat more high-fiber foods, such as fresh vegetables or fruit and whole grains. Drink plenty of fluids to keep your urine clear or pale yellow.  Take warm sitz baths to soothe any pain or discomfort caused by hemorrhoids. Use hemorrhoid cream if your caregiver approves.  If you develop varicose veins, wear support hose. Elevate your feet for 15 minutes, 3-4 times a day. Limit salt in your diet.  Avoid heavy lifting, wear low heal shoes, and practice good posture.  Rest a lot with your legs elevated if you have leg cramps or low back pain.  Visit your dentist if you have not gone during your pregnancy. Use a soft toothbrush to brush your teeth and be gentle when you floss.  A sexual relationship may be continued unless your caregiver directs you otherwise.  Do not travel far distances unless it is absolutely necessary and only with the approval  of your caregiver.  Take prenatal classes to understand, practice, and ask questions about the labor and delivery.  Make a trial run to the hospital.  Pack your hospital bag.  Prepare the baby's nursery.  Continue to go to all your prenatal visits as directed by your caregiver. SEEK MEDICAL CARE IF:  You are unsure if you are in labor or if your water has broken.  You have dizziness.  You have mild pelvic cramps, pelvic pressure, or nagging pain in your abdominal area.  You have persistent nausea, vomiting, or diarrhea.  You have a bad smelling vaginal discharge.  You have pain with urination. SEEK IMMEDIATE MEDICAL CARE IF:   You have a fever.  You are leaking fluid from your vagina.  You have spotting or bleeding from your vagina.  You have severe abdominal cramping or pain.  You have rapid weight loss or gain.  You have shortness of breath with chest pain.  You notice sudden or extreme swelling   of your face, hands, ankles, feet, or legs.  You have not felt your baby move in over an hour.  You have severe headaches that do not go away with medicine.  You have vision changes. Document Released: 11/09/2001 Document Revised: 11/20/2013 Document Reviewed: 01/16/2013 Exit[employer] Patient Information 2015 [employer]. This information is not intended to replace advice given to you by your health care provider. Make sure you discuss any questions you have with your health care provider.  

## 2014-11-12 ENCOUNTER — Telehealth: Payer: Self-pay | Admitting: Women's Health

## 2014-11-12 NOTE — Telephone Encounter (Signed)
Pt reports lost mucus plug today, she states she is having some mild contractions, denies gush of fluids, reports good FM, no vaginal bleeding.  Pt is scheduled for routine OB vitis this Friday.  Advised pt if has sudden gush of fluids or regular contractions 5 - 7 min apart to [facility] otherwise keep fridays appointment.  Pt verbalized understanding.

## 2014-11-13 LAB — RESPIRATORY VIRUS PANEL
Adenovirus: NOT DETECTED
INFLUENZA A H3: NOT DETECTED
INFLUENZA A: NOT DETECTED
Influenza A H1: NOT DETECTED
Influenza B: NOT DETECTED
METAPNEUMOVIRUS: NOT DETECTED
PARAINFLUENZA 1 A: NOT DETECTED
Parainfluenza 2: NOT DETECTED
Parainfluenza 3: NOT DETECTED
Respiratory Syncytial Virus A: NOT DETECTED
Respiratory Syncytial Virus B: NOT DETECTED
Rhinovirus: NOT DETECTED

## 2014-11-15 ENCOUNTER — Encounter: Payer: Self-pay | Admitting: Obstetrics & Gynecology

## 2014-11-15 ENCOUNTER — Ambulatory Visit (INDEPENDENT_AMBULATORY_CARE_PROVIDER_SITE_OTHER): Payer: BC Managed Care – PPO | Admitting: Obstetrics & Gynecology

## 2014-11-15 VITALS — BP 120/80 | Wt 151.0 lb

## 2014-11-15 DIAGNOSIS — Z3483 Encounter for supervision of other normal pregnancy, third trimester: Secondary | ICD-10-CM

## 2014-11-15 DIAGNOSIS — Z1389 Encounter for screening for other disorder: Secondary | ICD-10-CM

## 2014-11-15 DIAGNOSIS — Z331 Pregnant state, incidental: Secondary | ICD-10-CM

## 2014-11-15 LAB — POCT URINALYSIS DIPSTICK
GLUCOSE UA: NEGATIVE
Ketones, UA: NEGATIVE
LEUKOCYTES UA: NEGATIVE
NITRITE UA: NEGATIVE
Protein, UA: NEGATIVE

## 2014-11-15 NOTE — Addendum Note (Signed)
Addended by: Criss AlvinePULLIAM, CHRYSTAL G on: 11/15/2014 11:54 AM   Modules accepted: Orders

## 2014-11-21 ENCOUNTER — Ambulatory Visit (INDEPENDENT_AMBULATORY_CARE_PROVIDER_SITE_OTHER): Payer: BC Managed Care – PPO | Admitting: Obstetrics & Gynecology

## 2014-11-21 ENCOUNTER — Encounter: Payer: Self-pay | Admitting: Obstetrics & Gynecology

## 2014-11-21 VITALS — BP 118/76 | Wt 149.0 lb

## 2014-11-21 DIAGNOSIS — O34219 Maternal care for unspecified type scar from previous cesarean delivery: Secondary | ICD-10-CM

## 2014-11-21 DIAGNOSIS — Z331 Pregnant state, incidental: Secondary | ICD-10-CM

## 2014-11-21 DIAGNOSIS — O3421 Maternal care for scar from previous cesarean delivery: Secondary | ICD-10-CM

## 2014-11-21 DIAGNOSIS — Z1389 Encounter for screening for other disorder: Secondary | ICD-10-CM

## 2014-11-21 DIAGNOSIS — Z3483 Encounter for supervision of other normal pregnancy, third trimester: Secondary | ICD-10-CM

## 2014-11-21 LAB — POCT URINALYSIS DIPSTICK
Blood, UA: NEGATIVE
Glucose, UA: NEGATIVE
Ketones, UA: NEGATIVE
LEUKOCYTES UA: NEGATIVE
NITRITE UA: NEGATIVE
PROTEIN UA: NEGATIVE

## 2014-11-21 NOTE — Progress Notes (Signed)
G2P1001 6545w1d Estimated Date of Delivery: 12/04/14  Blood pressure 118/76, weight 149 lb (67.586 kg), last menstrual period 03/08/2014, not currently breastfeeding.   BP weight and urine results all reviewed and noted.  Please refer to the obstetrical flow sheet for the fundal height and fetal heart rate documentation:  Patient reports good fetal movement, denies any bleeding and no rupture of membranes symptoms or regular contractions. Patient is without complaints. All questions were answered.  Plan:  Continued routine obstetrical care,   Follow up in 12/04/2014 weeks for OB appointment, post op

## 2014-11-21 NOTE — Progress Notes (Signed)
Pt denies any problems or concerns at this time. Pt states that she has lost her mucus plug.

## 2014-11-26 ENCOUNTER — Encounter (HOSPITAL_COMMUNITY): Payer: Self-pay

## 2014-11-26 ENCOUNTER — Encounter (HOSPITAL_COMMUNITY)
Admission: RE | Admit: 2014-11-26 | Discharge: 2014-11-26 | Disposition: A | Discharge status: Home / Self Care | Payer: BC Managed Care – PPO | Source: Ambulatory Visit | Attending: Obstetrics & Gynecology | Admitting: Obstetrics & Gynecology

## 2014-11-26 VITALS — BP 108/76 | HR 87 | Resp 18 | Ht <= 58 in | Wt 150.0 lb

## 2014-11-26 DIAGNOSIS — O34219 Maternal care for unspecified type scar from previous cesarean delivery: Secondary | ICD-10-CM

## 2014-11-26 HISTORY — DX: Anxiety disorder, unspecified: F41.9

## 2014-11-26 HISTORY — DX: Anogenital (venereal) warts: A63.0

## 2014-11-26 HISTORY — DX: Other specified pregnancy related conditions, unspecified trimester: R12

## 2014-11-26 HISTORY — DX: Other specified pregnancy related conditions, unspecified trimester: O26.899

## 2014-11-26 HISTORY — DX: Hypothyroidism, unspecified: E03.9

## 2014-11-26 LAB — TYPE AND SCREEN
ABO/RH(D): O POS
Antibody Screen: NEGATIVE

## 2014-11-26 LAB — CBC
HCT: 33 % — ABNORMAL LOW (ref 36.0–46.0)
HEMOGLOBIN: 11.5 g/dL — AB (ref 12.0–15.0)
MCH: 34.2 pg — AB (ref 26.0–34.0)
MCHC: 34.8 g/dL (ref 30.0–36.0)
MCV: 98.2 fL (ref 78.0–100.0)
PLATELETS: 291 10*3/uL (ref 150–400)
RBC: 3.36 MIL/uL — ABNORMAL LOW (ref 3.87–5.11)
RDW: 13.7 % (ref 11.5–15.5)
WBC: 11.3 10*3/uL — ABNORMAL HIGH (ref 4.0–10.5)

## 2014-11-26 LAB — ABO/RH: ABO/RH(D): O POS

## 2014-11-26 MED ORDER — GENTAMICIN SULFATE 40 MG/ML IJ SOLN
INTRAVENOUS | Status: AC
  Administered 2014-11-27: 114.5 mL via INTRAVENOUS
  Filled 2014-11-26: qty 8.5

## 2014-11-26 NOTE — Patient Instructions (Addendum)
   Your procedure is scheduled on: Wednesday, Dec 30  [facility]: 7:30 AM Pick up the phone at the desk and dial [phone] and inform us of your arrival.  Please call this number if you have any problems the morning of surgery: [phone]  Remember: Do not eat or drink after midnight: Tonight Take these medicines the morning of surgery with a SIP OF WATER: synthroid  Do not wear jewelry, make-up, or FINGER nail polish No metal in your hair or on your body. Do not wear lotions, powders, perfumes.  You may wear deodorant.  Do not bring valuables to the hospital. Contacts, dentures or bridgework may not be worn into surgery.  Leave suitcase in the car. After Surgery it may be brought to your room. For patients being admitted to the hospital, checkout time is 11:00am the day of discharge.  Home with [facility] cell [phone]

## 2014-11-27 ENCOUNTER — Inpatient Hospital Stay (HOSPITAL_COMMUNITY)
Admission: RE | Admit: 2014-11-27 | Discharge: 2014-11-29 | DRG: 766 | Disposition: A | Discharge status: Home / Self Care | Payer: BC Managed Care – PPO | Source: Ambulatory Visit | Attending: Obstetrics & Gynecology | Admitting: Obstetrics & Gynecology

## 2014-11-27 ENCOUNTER — Inpatient Hospital Stay (HOSPITAL_COMMUNITY): Payer: BC Managed Care – PPO | Admitting: Anesthesiology

## 2014-11-27 ENCOUNTER — Encounter (HOSPITAL_COMMUNITY)
Admission: RE | Disposition: A | Discharge status: Home / Self Care | Payer: Self-pay | Source: Ambulatory Visit | Attending: Obstetrics & Gynecology

## 2014-11-27 ENCOUNTER — Encounter (HOSPITAL_COMMUNITY): Payer: Self-pay | Admitting: Anesthesiology

## 2014-11-27 DIAGNOSIS — Z98891 History of uterine scar from previous surgery: Secondary | ICD-10-CM

## 2014-11-27 DIAGNOSIS — O3421 Maternal care for scar from previous cesarean delivery: Principal | ICD-10-CM | POA: Diagnosis present

## 2014-11-27 DIAGNOSIS — Z803 Family history of malignant neoplasm of breast: Secondary | ICD-10-CM

## 2014-11-27 DIAGNOSIS — Z8349 Family history of other endocrine, nutritional and metabolic diseases: Secondary | ICD-10-CM

## 2014-11-27 DIAGNOSIS — Z3A39 39 weeks gestation of pregnancy: Secondary | ICD-10-CM | POA: Diagnosis present

## 2014-11-27 DIAGNOSIS — Z880 Allergy status to penicillin: Secondary | ICD-10-CM | POA: Diagnosis not present

## 2014-11-27 DIAGNOSIS — Z87891 Personal history of nicotine dependence: Secondary | ICD-10-CM

## 2014-11-27 DIAGNOSIS — Z881 Allergy status to other antibiotic agents status: Secondary | ICD-10-CM | POA: Diagnosis not present

## 2014-11-27 DIAGNOSIS — O34219 Maternal care for unspecified type scar from previous cesarean delivery: Secondary | ICD-10-CM

## 2014-11-27 LAB — RPR

## 2014-11-27 SURGERY — Surgical Case
Anesthesia: Spinal

## 2014-11-27 MED ORDER — NALBUPHINE HCL 10 MG/ML IJ SOLN
5.0000 mg | Freq: Once | INTRAMUSCULAR | Status: DC | PRN
Start: 2014-11-27 — End: 2014-11-27

## 2014-11-27 MED ORDER — INFLUENZA VAC SPLIT QUAD 0.5 ML IM SUSY
0.5000 mL | PREFILLED_SYRINGE | INTRAMUSCULAR | Status: AC
  Administered 2014-11-28: 0.5 mL via INTRAMUSCULAR

## 2014-11-27 MED ORDER — IBUPROFEN 600 MG PO TABS
600.0000 mg | ORAL_TABLET | Freq: Four times a day (QID) | ORAL | Status: DC
  Administered 2014-11-27 – 2014-11-29 (×8): 600 mg via ORAL
  Filled 2014-11-27 (×8): qty 1

## 2014-11-27 MED ORDER — ONDANSETRON HCL 4 MG/2ML IJ SOLN
INTRAMUSCULAR | Status: AC
  Filled 2014-11-27: qty 2

## 2014-11-27 MED ORDER — ZOLPIDEM TARTRATE 5 MG PO TABS: 5.0000 mg | ORAL_TABLET | Freq: Every evening | ORAL | Status: DC | PRN

## 2014-11-27 MED ORDER — FENTANYL CITRATE 0.05 MG/ML IJ SOLN
INTRAMUSCULAR | Status: DC | PRN
  Administered 2014-11-27: 25 ug via INTRATHECAL

## 2014-11-27 MED ORDER — TETANUS-DIPHTH-ACELL PERTUSSIS 5-2.5-18.5 LF-MCG/0.5 IM SUSP: 0.5000 mL | Freq: Once | INTRAMUSCULAR | Status: DC

## 2014-11-27 MED ORDER — BUPIVACAINE LIPOSOME 1.3 % IJ SUSP
20.0000 mL | Freq: Once | INTRAMUSCULAR | Status: DC
  Filled 2014-11-27: qty 20

## 2014-11-27 MED ORDER — LACTATED RINGERS IV SOLN
INTRAVENOUS | Status: DC
  Administered 2014-11-27 (×3): via INTRAVENOUS

## 2014-11-27 MED ORDER — DIBUCAINE 1 % RE OINT: 1.0000 "application " | TOPICAL_OINTMENT | RECTAL | Status: DC | PRN

## 2014-11-27 MED ORDER — PRENATAL MULTIVITAMIN CH
1.0000 | ORAL_TABLET | Freq: Every day | ORAL | Status: DC
  Administered 2014-11-28 – 2014-11-29 (×2): 1 via ORAL
  Filled 2014-11-27 (×2): qty 1

## 2014-11-27 MED ORDER — OXYTOCIN 40 UNITS IN LACTATED RINGERS INFUSION - SIMPLE MED: 62.5000 mL/h | INTRAVENOUS | Status: AC

## 2014-11-27 MED ORDER — MEPERIDINE HCL 25 MG/ML IJ SOLN: 6.2500 mg | INTRAMUSCULAR | Status: DC | PRN

## 2014-11-27 MED ORDER — METOCLOPRAMIDE HCL 5 MG/ML IJ SOLN
INTRAMUSCULAR | Status: DC | PRN
  Administered 2014-11-27 (×2): 5 mg via INTRAVENOUS

## 2014-11-27 MED ORDER — PHENYLEPHRINE 8 MG IN D5W 100 ML (0.08MG/ML) PREMIX OPTIME
INJECTION | INTRAVENOUS | Status: DC | PRN
  Administered 2014-11-27: 60 ug/min via INTRAVENOUS

## 2014-11-27 MED ORDER — SIMETHICONE 80 MG PO CHEW
80.0000 mg | CHEWABLE_TABLET | ORAL | Status: DC | PRN
  Administered 2014-11-29: 80 mg via ORAL

## 2014-11-27 MED ORDER — DIPHENHYDRAMINE HCL 50 MG/ML IJ SOLN: 12.5000 mg | INTRAMUSCULAR | Status: DC | PRN

## 2014-11-27 MED ORDER — SCOPOLAMINE 1 MG/3DAYS TD PT72
1.0000 | MEDICATED_PATCH | Freq: Once | TRANSDERMAL | Status: DC
  Administered 2014-11-27: 1.5 mg via TRANSDERMAL

## 2014-11-27 MED ORDER — SIMETHICONE 80 MG PO CHEW
80.0000 mg | CHEWABLE_TABLET | Freq: Three times a day (TID) | ORAL | Status: DC
  Administered 2014-11-27 – 2014-11-29 (×4): 80 mg via ORAL
  Filled 2014-11-27 (×5): qty 1

## 2014-11-27 MED ORDER — SCOPOLAMINE 1 MG/3DAYS TD PT72
MEDICATED_PATCH | TRANSDERMAL | Status: AC
  Administered 2014-11-27: 1.5 mg via TRANSDERMAL
  Filled 2014-11-27: qty 1

## 2014-11-27 MED ORDER — ONDANSETRON HCL 4 MG/2ML IJ SOLN: 4.0000 mg | INTRAMUSCULAR | Status: DC | PRN

## 2014-11-27 MED ORDER — DIPHENHYDRAMINE HCL 25 MG PO CAPS
25.0000 mg | ORAL_CAPSULE | Freq: Four times a day (QID) | ORAL | Status: DC | PRN
  Administered 2014-11-27: 25 mg via ORAL
  Filled 2014-11-27: qty 1

## 2014-11-27 MED ORDER — DIPHENHYDRAMINE HCL 25 MG PO CAPS
25.0000 mg | ORAL_CAPSULE | ORAL | Status: DC | PRN
  Filled 2014-11-27: qty 1

## 2014-11-27 MED ORDER — WITCH HAZEL-GLYCERIN EX PADS: 1.0000 "application " | MEDICATED_PAD | CUTANEOUS | Status: DC | PRN

## 2014-11-27 MED ORDER — OXYTOCIN 40 UNITS IN LACTATED RINGERS INFUSION - SIMPLE MED
INTRAVENOUS | Status: DC | PRN
  Administered 2014-11-27: 40 [IU] via INTRAVENOUS

## 2014-11-27 MED ORDER — NALOXONE HCL 1 MG/ML IJ SOLN
1.0000 ug/kg/h | INTRAVENOUS | Status: DC | PRN
  Filled 2014-11-27: qty 2

## 2014-11-27 MED ORDER — SCOPOLAMINE 1 MG/3DAYS TD PT72: 1.0000 | MEDICATED_PATCH | Freq: Once | TRANSDERMAL | Status: DC

## 2014-11-27 MED ORDER — LACTATED RINGERS IV SOLN
INTRAVENOUS | Status: DC | PRN
  Administered 2014-11-27: 10:00:00 via INTRAVENOUS

## 2014-11-27 MED ORDER — SODIUM CHLORIDE 0.9 % IJ SOLN
INTRAMUSCULAR | Status: AC
  Filled 2014-11-27: qty 50

## 2014-11-27 MED ORDER — NALBUPHINE HCL 10 MG/ML IJ SOLN: 5.0000 mg | Freq: Once | INTRAMUSCULAR | Status: DC | PRN

## 2014-11-27 MED ORDER — KETOROLAC TROMETHAMINE 30 MG/ML IJ SOLN: 30.0000 mg | Freq: Four times a day (QID) | INTRAMUSCULAR | Status: DC | PRN

## 2014-11-27 MED ORDER — NALOXONE HCL 0.4 MG/ML IJ SOLN: 0.4000 mg | INTRAMUSCULAR | Status: DC | PRN

## 2014-11-27 MED ORDER — MORPHINE SULFATE (PF) 0.5 MG/ML IJ SOLN
INTRAMUSCULAR | Status: DC | PRN
  Administered 2014-11-27: .15 mg via INTRATHECAL

## 2014-11-27 MED ORDER — PHENYLEPHRINE 8 MG IN D5W 100 ML (0.08MG/ML) PREMIX OPTIME
INJECTION | INTRAVENOUS | Status: AC
  Filled 2014-11-27: qty 100

## 2014-11-27 MED ORDER — METOCLOPRAMIDE HCL 5 MG/ML IJ SOLN
INTRAMUSCULAR | Status: AC
  Filled 2014-11-27: qty 2

## 2014-11-27 MED ORDER — METHYLERGONOVINE MALEATE 0.2 MG PO TABS: 0.2000 mg | ORAL_TABLET | ORAL | Status: DC | PRN

## 2014-11-27 MED ORDER — FENTANYL CITRATE 0.05 MG/ML IJ SOLN: 25.0000 ug | INTRAMUSCULAR | Status: DC | PRN

## 2014-11-27 MED ORDER — FENTANYL CITRATE 0.05 MG/ML IJ SOLN
INTRAMUSCULAR | Status: AC
  Filled 2014-11-27: qty 2

## 2014-11-27 MED ORDER — MENTHOL 3 MG MT LOZG: 1.0000 | LOZENGE | OROMUCOSAL | Status: DC | PRN

## 2014-11-27 MED ORDER — MORPHINE SULFATE 0.5 MG/ML IJ SOLN
INTRAMUSCULAR | Status: AC
  Filled 2014-11-27: qty 10

## 2014-11-27 MED ORDER — KETOROLAC TROMETHAMINE 30 MG/ML IJ SOLN
30.0000 mg | Freq: Four times a day (QID) | INTRAMUSCULAR | Status: DC | PRN
  Administered 2014-11-27: 30 mg via INTRAMUSCULAR

## 2014-11-27 MED ORDER — ONDANSETRON HCL 4 MG PO TABS: 4.0000 mg | ORAL_TABLET | ORAL | Status: DC | PRN

## 2014-11-27 MED ORDER — METOCLOPRAMIDE HCL 5 MG/ML IJ SOLN: 10.0000 mg | Freq: Once | INTRAMUSCULAR | Status: DC | PRN

## 2014-11-27 MED ORDER — LANOLIN HYDROUS EX OINT: 1.0000 "application " | TOPICAL_OINTMENT | CUTANEOUS | Status: DC | PRN

## 2014-11-27 MED ORDER — SODIUM CHLORIDE 0.9 % IJ SOLN: 3.0000 mL | INTRAMUSCULAR | Status: DC | PRN

## 2014-11-27 MED ORDER — OXYTOCIN 10 UNIT/ML IJ SOLN
INTRAMUSCULAR | Status: AC
  Filled 2014-11-27: qty 4

## 2014-11-27 MED ORDER — BUPIVACAINE IN DEXTROSE 0.75-8.25 % IT SOLN
INTRATHECAL | Status: DC | PRN
  Administered 2014-11-27: 1.3 mL via INTRATHECAL

## 2014-11-27 MED ORDER — BUPIVACAINE LIPOSOME 1.3 % IJ SUSP
INTRAMUSCULAR | Status: DC | PRN
  Administered 2014-11-27: 40 mL

## 2014-11-27 MED ORDER — PHENYLEPHRINE 40 MCG/ML (10ML) SYRINGE FOR IV PUSH (FOR BLOOD PRESSURE SUPPORT)
PREFILLED_SYRINGE | INTRAVENOUS | Status: AC
  Filled 2014-11-27: qty 5

## 2014-11-27 MED ORDER — ONDANSETRON HCL 4 MG/2ML IJ SOLN
INTRAMUSCULAR | Status: DC | PRN
  Administered 2014-11-27: 4 mg via INTRAVENOUS

## 2014-11-27 MED ORDER — LACTATED RINGERS IV SOLN
Freq: Once | INTRAVENOUS | Status: AC
  Administered 2014-11-27: 08:00:00 via INTRAVENOUS

## 2014-11-27 MED ORDER — SIMETHICONE 80 MG PO CHEW
80.0000 mg | CHEWABLE_TABLET | ORAL | Status: DC
  Administered 2014-11-29: 80 mg via ORAL
  Filled 2014-11-27 (×2): qty 1

## 2014-11-27 MED ORDER — LACTATED RINGERS IV SOLN
INTRAVENOUS | Status: DC
  Administered 2014-11-28: 01:00:00 via INTRAVENOUS

## 2014-11-27 MED ORDER — OXYCODONE-ACETAMINOPHEN 5-325 MG PO TABS
1.0000 | ORAL_TABLET | ORAL | Status: DC | PRN
  Administered 2014-11-28 – 2014-11-29 (×5): 1 via ORAL
  Filled 2014-11-27 (×6): qty 1

## 2014-11-27 MED ORDER — NALBUPHINE HCL 10 MG/ML IJ SOLN: 5.0000 mg | INTRAMUSCULAR | Status: DC | PRN

## 2014-11-27 MED ORDER — KETOROLAC TROMETHAMINE 30 MG/ML IJ SOLN
INTRAMUSCULAR | Status: AC
  Filled 2014-11-27: qty 1

## 2014-11-27 MED ORDER — METHYLERGONOVINE MALEATE 0.2 MG/ML IJ SOLN: 0.2000 mg | INTRAMUSCULAR | Status: DC | PRN

## 2014-11-27 MED ORDER — OXYCODONE-ACETAMINOPHEN 5-325 MG PO TABS: 2.0000 | ORAL_TABLET | ORAL | Status: DC | PRN

## 2014-11-27 MED ORDER — ONDANSETRON HCL 4 MG/2ML IJ SOLN: 4.0000 mg | Freq: Three times a day (TID) | INTRAMUSCULAR | Status: DC | PRN

## 2014-11-27 MED ORDER — SENNOSIDES-DOCUSATE SODIUM 8.6-50 MG PO TABS
2.0000 | ORAL_TABLET | ORAL | Status: DC
  Administered 2014-11-29: 2 via ORAL
  Filled 2014-11-27 (×2): qty 2

## 2014-11-27 MED ORDER — LEVOTHYROXINE SODIUM 75 MCG PO TABS
75.0000 ug | ORAL_TABLET | Freq: Every day | ORAL | Status: DC
  Administered 2014-11-28 – 2014-11-29 (×2): 75 ug via ORAL
  Filled 2014-11-27 (×3): qty 1

## 2014-11-27 SURGICAL SUPPLY — 39 items
CLAMP CORD UMBIL (MISCELLANEOUS) IMPLANT
CLOTH BEACON ORANGE TIMEOUT ST (SAFETY) ×3 IMPLANT
DERMABOND ADHESIVE PROPEN (GAUZE/BANDAGES/DRESSINGS) ×2
DERMABOND ADVANCED .7 DNX6 (GAUZE/BANDAGES/DRESSINGS) ×1 IMPLANT
DRAPE SHEET LG 3/4 BI-LAMINATE (DRAPES) ×3 IMPLANT
DRSG OPSITE POSTOP 4X10 (GAUZE/BANDAGES/DRESSINGS) ×3 IMPLANT
DURAPREP 26ML APPLICATOR (WOUND CARE) ×6 IMPLANT
ELECT REM PT RETURN 9FT ADLT (ELECTROSURGICAL) ×3
ELECTRODE REM PT RTRN 9FT ADLT (ELECTROSURGICAL) ×1 IMPLANT
EXTRACTOR VACUUM BELL STYLE (SUCTIONS) IMPLANT
EXTRACTOR VACUUM M CUP 4 TUBE (SUCTIONS) ×2 IMPLANT
EXTRACTOR VACUUM M CUP 4' TUBE (SUCTIONS) ×1
GLOVE BIOGEL PI IND STRL 8 (GLOVE) ×1 IMPLANT
GLOVE BIOGEL PI INDICATOR 8 (GLOVE) ×2
GLOVE ECLIPSE 8.0 STRL XLNG CF (GLOVE) ×3 IMPLANT
GOWN STRL REUS W/TWL LRG LVL3 (GOWN DISPOSABLE) ×6 IMPLANT
KIT ABG SYR 3ML LUER SLIP (SYRINGE) ×3 IMPLANT
LIQUID BAND (GAUZE/BANDAGES/DRESSINGS) ×6 IMPLANT
NEEDLE HYPO 18GX1.5 BLUNT FILL (NEEDLE) ×3 IMPLANT
NEEDLE HYPO 22GX1.5 SAFETY (NEEDLE) ×3 IMPLANT
NEEDLE HYPO 25X5/8 SAFETYGLIDE (NEEDLE) ×3 IMPLANT
NS IRRIG 1000ML POUR BTL (IV SOLUTION) ×3 IMPLANT
PACK C SECTION WH (CUSTOM PROCEDURE TRAY) ×3 IMPLANT
PAD OB MATERNITY 4.3X12.25 (PERSONAL CARE ITEMS) ×3 IMPLANT
RTRCTR C-SECT PINK 25CM LRG (MISCELLANEOUS) IMPLANT
STAPLER VISISTAT 35W (STAPLE) IMPLANT
SUT CHROMIC 0 CT 1 (SUTURE) ×3 IMPLANT
SUT MNCRL 0 VIOLET CTX 36 (SUTURE) ×2 IMPLANT
SUT MONOCRYL 0 CTX 36 (SUTURE) ×4
SUT PLAIN 2 0 (SUTURE)
SUT PLAIN 2 0 XLH (SUTURE) IMPLANT
SUT PLAIN ABS 2-0 CT1 27XMFL (SUTURE) IMPLANT
SUT VIC AB 0 CTX 36 (SUTURE) ×2
SUT VIC AB 0 CTX36XBRD ANBCTRL (SUTURE) ×1 IMPLANT
SUT VIC AB 4-0 KS 27 (SUTURE) ×3 IMPLANT
SYR 20CC LL (SYRINGE) ×6 IMPLANT
TOWEL OR 17X24 6PK STRL BLUE (TOWEL DISPOSABLE) ×3 IMPLANT
TRAY FOLEY CATH 14FR (SET/KITS/TRAYS/PACK) ×3 IMPLANT
WATER STERILE IRR 1000ML POUR (IV SOLUTION) ×3 IMPLANT

## 2014-11-27 NOTE — H&P (Signed)
Preoperative History and Physical  Chloe [name] is a 28 y.o. G2P1001 with Patient's last menstrual period was 03/08/2014. admitted for a repeat Caesarean section, previous section declines trial of labor.    PMH:    Past Medical History  Diagnosis Date  . Thyroid disease   . Hypothyroidism   . Anxiety     Hx - no meds  . Heartburn during pregnancy   . HPV (human papilloma virus) anogenital infection     PSH:     Past Surgical History  Procedure Laterality Date  . Cesarean section      [place]  . Tooth extraction      POb/GynH:      OB History    Gravida Para Term Preterm AB TAB SAB Ectopic Multiple Living   2 1 1       1       SH:   History  Substance Use Topics  . Smoking status: Former Smoker -- 0.50 packs/day for 8 years    Types: Cigarettes    Quit date: 05/29/2014  . Smokeless tobacco: Never Used  . Alcohol Use: Yes     Comment: occasional but none with pregnancy    FH:    Family History  Problem Relation Age of Onset  . Diabetes Paternal Grandfather   . Diabetes Paternal Grandmother   . Epilepsy Father   . Breast cancer Mother   . Thyroid disease Mother      Allergies:  Allergies  Allergen Reactions  . Amoxicillin Nausea And Vomiting  . Azithromycin Other (See Comments)    Abdominal pain.  . Cephalosporins Hives  . Penicillins Hives    Medications:      Current facility-administered medications: bupivacaine liposome (EXPAREL) 1.3 % injection 266 mg, 20 mL, Infiltration, Once, Lazaro Arms, MD;  gentamicin (GARAMYCIN) 340 mg, clindamycin (CLEOCIN) 900 mg in dextrose 5 % 100 mL IVPB, , Intravenous, On Call to OR, Lazaro Arms, MD;  lactated ringers infusion, , Intravenous, Continuous, Cristela Blue, MD scopolamine (TRANSDERM-SCOP) 1 MG/3DAYS 1.5 mg, 1 patch, Transdermal, Once, Cristela Blue, MD, 1.5 mg at 11/27/14 1610  Review of Systems:   Review of Systems  Constitutional: Negative for fever, chills, weight loss, malaise/fatigue and  diaphoresis.  HENT: Negative for hearing loss, ear pain, nosebleeds, congestion, sore throat, neck pain, tinnitus and ear discharge.   Eyes: Negative for blurred vision, double vision, photophobia, pain, discharge and redness.  Respiratory: Negative for cough, hemoptysis, sputum production, shortness of breath, wheezing and stridor.   Cardiovascular: Negative for chest pain, palpitations, orthopnea, claudication, leg swelling and PND.  Gastrointestinal: Positive for abdominal pain. Negative for heartburn, nausea, vomiting, diarrhea, constipation, blood in stool and melena.  Genitourinary: Negative for dysuria, urgency, frequency, hematuria and flank pain.  Musculoskeletal: Negative for myalgias, back pain, joint pain and falls.  Skin: Negative for itching and rash.  Neurological: Negative for dizziness, tingling, tremors, sensory change, speech change, focal weakness, seizures, loss of consciousness, weakness and headaches.  Endo/Heme/Allergies: Negative for environmental allergies and polydipsia. Does not bruise/bleed easily.  Psychiatric/Behavioral: Negative for depression, suicidal ideas, hallucinations, memory loss and substance abuse. The patient is not nervous/anxious and does not have insomnia.      PHYSICAL EXAM:  Blood pressure 111/73, pulse 83, temperature 97.5 F (36.4 C), temperature source Oral, resp. rate 18, height 4\' 8"  (1.422 m), weight 149 lb (67.586 kg), last menstrual period 03/08/2014, SpO2 98 %, not currently breastfeeding.    Vitals reviewed. Constitutional: She is  oriented to person, place, and time. She appears well-developed and well-nourished.  HENT:  Head: Normocephalic and atraumatic.  Right Ear: External ear normal.  Left Ear: External ear normal.  Nose: Nose normal.  Mouth/Throat: Oropharynx is clear and moist.  Eyes: Conjunctivae and EOM are normal. Pupils are equal, round, and reactive to light. Right eye exhibits no discharge. Left eye exhibits no  discharge. No scleral icterus.  Neck: Normal range of motion. Neck supple. No tracheal deviation present. No thyromegaly present.  Cardiovascular: Normal rate, regular rhythm, normal heart sounds and intact distal pulses.  Exam reveals no gallop and no friction rub.   No murmur heard. Respiratory: Effort normal and breath sounds normal. No respiratory distress. She has no wheezes. She has no rales. She exhibits no tenderness.  GI: Soft. Bowel sounds are normal. She exhibits no distension and no mass. There is tenderness. There is no rebound and no guarding.  Genitourinary:       Vulva is normal without lesions Vagina is pink moist without discharge Cervix normal in appearance and pap is normal Uterus is enlarged to term size Adnexa is negative with normal sized ovaries by sonogram  Musculoskeletal: Normal range of motion. She exhibits no edema and no tenderness.  Neurological: She is alert and oriented to person, place, and time. She has normal reflexes. She displays normal reflexes. No cranial nerve deficit. She exhibits normal muscle tone. Coordination normal.  Skin: Skin is warm and dry. No rash noted. No erythema. No pallor.  Psychiatric: She has a normal mood and affect. Her behavior is normal. Judgment and thought content normal.    Labs: Results for orders placed or performed during the hospital encounter of 11/26/14 (from the past 336 hour(s))  CBC   Collection Time: 11/26/14  3:34 PM  Result Value Ref Range   WBC 11.3 (H) 4.0 - 10.5 K/uL   RBC 3.36 (L) 3.87 - 5.11 MIL/uL   Hemoglobin 11.5 (L) 12.0 - 15.0 g/dL   HCT 96.033.0 (L) 45.436.0 - 09.846.0 %   MCV 98.2 78.0 - 100.0 fL   MCH 34.2 (H) 26.0 - 34.0 pg   MCHC 34.8 30.0 - 36.0 g/dL   RDW 11.913.7 14.711.5 - 82.915.5 %   Platelets 291 150 - 400 K/uL  Type and screen   Collection Time: 11/26/14  3:34 PM  Result Value Ref Range   ABO/RH(D) O POS    Antibody Screen NEG    Sample Expiration 11/29/2014   ABO/Rh   Collection Time: 11/26/14  3:34  PM  Result Value Ref Range   ABO/RH(D) O POS   Results for orders placed or performed in visit on 11/21/14 (from the past 336 hour(s))  POCT urinalysis dipstick   Collection Time: 11/21/14 10:34 AM  Result Value Ref Range   Color, UA     Clarity, UA     Glucose, UA neg    Bilirubin, UA     Ketones, UA neg    Spec Grav, UA     Blood, UA neg    pH, UA     Protein, UA neg    Urobilinogen, UA     Nitrite, UA neg    Leukocytes, UA Negative   Results for orders placed or performed in visit on 11/15/14 (from the past 336 hour(s))  POCT urinalysis dipstick   Collection Time: 11/15/14 11:54 AM  Result Value Ref Range   Color, UA     Clarity, UA     Glucose, UA neg  Bilirubin, UA     Ketones, UA neg    Spec Grav, UA     Blood, UA trace    pH, UA     Protein, UA neg    Urobilinogen, UA     Nitrite, UA neg    Leukocytes, UA Negative     EKG: No orders found for this or any previous visit.  Imaging Studies: Dg Chest 2 View  11/11/2014   CLINICAL DATA:  Shortness of breath. Tightness in the chest. Third trimester pregnancy (abdomen double shielded).  EXAM: CHEST  2 VIEW  COMPARISON:  None.  FINDINGS: The heart size and mediastinal contours are within normal limits. Both lungs are clear. The visualized skeletal structures are unremarkable.  IMPRESSION: No active cardiopulmonary disease.   Electronically Signed   By: Herbie BaltimoreWalt  Liebkemann M.D.   On: 11/11/2014 17:30      Assessment: 8248w0d Estimated Date of Delivery: 12/04/14  Previous Caesarean section declines TOL Patient Active Problem List   Diagnosis Date Noted  . Encounter for supervision of other normal pregnancy in second trimester 09/12/2014  . Atypical squamous cell changes of undetermined significance (ASCUS) on cervical cytology with positive high risk human papilloma virus (HPV) 05/28/2014  . Supervision of other normal pregnancy 05/07/2014  . Hypothyroidism complicating pregnancy 05/07/2014  . Previous cesarean  section complicating pregnancy 05/07/2014    Plan: Repeat Caesarean section      EURE,LUTHER H 11/27/2014 8:39 AM

## 2014-11-27 NOTE — Lactation Note (Signed)
This note was copied from the chart of Chloe [name]. Lactation Consultation Note  Patient Name: Chloe Derrell LollingMelissa Maish IONGE'XToday's Date: 11/27/2014 Reason for consult: Initial assessment;Infant < 6lbs  Lactation consult, infant 6 hours old. Baby has had two feedings for 1-5 minutes since birth. Mother reports baby fell asleep and came off breast with other feedings. Baby in football hold with mother attempting to latch upon my arrival. Mother appeared motivated and engaged in teaching and return demonstrations. Hand expression taught, colostrum present, infant latched deep on the breast with minimal assistance and fed in a rhythmic pattern. Swallows were heard and mother taught alternate breast massage to promote milk transfer during the feeding and empty the breast. Discussed supply and demand. Infant is 39 weeks and SGA. Discussed hand expression and spoon feeding for additional stimulation of the breast and additional calories. Presently the baby is feeding well, breast pump not initiated. Instructed mother to call RN for assistance with breastfeeding and spoon feeding as needed. LC to follow due to SGA and poor lactation history. Mother informed of post-discharge support and given phone number to the lactation department, including services for phone call assistance; out-patient appointments; and breastfeeding support group. List of other breastfeeding resources in the community given in the handout. Encouraged mother to call for problems or concerns related to breastfeeding.    Maternal Data Has patient been taught Hand Expression?: Yes (per BDaly RN, IBCLC) Does the patient have breastfeeding experience prior to this delivery?: Yes  Feeding Feeding Type: Breast Fed Length of feed: 20 min  LATCH Score/Interventions Latch: Grasps breast easily, tongue down, lips flanged, rhythmical sucking. (with assistance guiding baby to align with breast) Intervention(s): Adjust position;Assist with  latch;Breast massage  Audible Swallowing: Spontaneous and intermittent (mother had an flow of colostrum with hand expression pri or to latch)  Type of Nipple: Everted at rest and after stimulation  Comfort (Breast/Nipple): Soft / non-tender     Hold (Positioning): Assistance needed to correctly position infant at breast and maintain latch. Intervention(s): Breastfeeding basics reviewed;Skin to skin  LATCH Score: 9  Lactation Tools Discussed/Used     Consult Status Consult Status: Follow-up    Chloe [name], Chloe [name] 11/27/2014, 5:44 PM

## 2014-11-27 NOTE — Anesthesia Preprocedure Evaluation (Signed)
Anesthesia Evaluation  Patient identified by MRN, date of birth, ID band Patient awake    Reviewed: Allergy & Precautions, H&P , NPO status , Patient's Chart, lab work & pertinent test results  Airway Mallampati: III  TM Distance: >3 FB Neck ROM: Full    Dental no notable dental hx. (+) Teeth Intact   Pulmonary former smoker,  breath sounds clear to auscultation  Pulmonary exam normal       Cardiovascular negative cardio ROS  Rhythm:Regular Rate:Normal     Neuro/Psych Anxiety negative neurological ROS     GI/Hepatic Neg liver ROS, GERD-  Controlled and Medicated,  Endo/Other  Hypothyroidism Obesity  Renal/GU negative Renal ROS  negative genitourinary   Musculoskeletal negative musculoskeletal ROS (+)   Abdominal (+) + obese,   Peds  Hematology  (+) anemia ,   Anesthesia Other Findings   Reproductive/Obstetrics (+) Pregnancy Previous C/Section                             Anesthesia Physical Anesthesia Plan  ASA: III  Anesthesia Plan: Spinal   Post-op Pain Management:    Induction:   Airway Management Planned: Natural Airway  Additional Equipment:   Intra-op Plan:   Post-operative Plan:   Informed Consent: I have reviewed the patients History and Physical, chart, labs and discussed the procedure including the risks, benefits and alternatives for the proposed anesthesia with the patient or authorized representative who has indicated his/her understanding and acceptance.     Plan Discussed with: Anesthesiologist, CRNA and Surgeon  Anesthesia Plan Comments:         Anesthesia Quick Evaluation

## 2014-11-27 NOTE — Anesthesia Procedure Notes (Signed)
Spinal Patient location during procedure: OR Start time: 11/27/2014 9:22 AM Staffing Anesthesiologist: Loic Hobin A. Performed by: anesthesiologist  Preanesthetic Checklist Completed: patient identified, site marked, surgical consent, pre-op evaluation, timeout performed, IV checked, risks and benefits discussed and monitors and equipment checked Spinal Block Patient position: sitting Prep: site prepped and draped and DuraPrep Patient monitoring: heart rate, cardiac monitor, continuous pulse ox and blood pressure Approach: midline Location: L4-5 Injection technique: single-shot Needle Needle type: Sprotte  Needle gauge: 24 G Needle length: 9 cm Needle insertion depth: 4 cm Assessment Sensory level: T4 Additional Notes Patient tolerated procedure well. Adequate sensory level.

## 2014-11-27 NOTE — Transfer of Care (Signed)
Immediate Anesthesia Transfer of Care Note  Patient: Chloe [name]  Procedure(s) Performed: Procedure(s): REPEAT CESAREAN SECTION (N/A)  Patient Location: PACU  Anesthesia Type:Spinal  Level of Consciousness: awake, alert  and oriented  Airway & Oxygen Therapy: Patient Spontanous Breathing  Post-op Assessment: Report given to PACU RN and Post -op Vital signs reviewed and stable  Post vital signs: Reviewed and stable  Complications: No apparent anesthesia complications

## 2014-11-27 NOTE — Anesthesia Postprocedure Evaluation (Signed)
  Anesthesia Post-op Note  Patient: Chloe [name]  Procedure(s) Performed: Procedure(s): REPEAT CESAREAN SECTION (N/A)  Patient Location: PACU  Anesthesia Type:Spinal  Level of Consciousness: awake, alert  and oriented  Airway and Oxygen Therapy: Patient Spontanous Breathing  Post-op Pain: none  Post-op Assessment: Post-op Vital signs reviewed, Patient's Cardiovascular Status Stable, Respiratory Function Stable, Patent Airway, No signs of Nausea or vomiting, Pain level controlled, No headache, No backache and No residual numbness  Post-op Vital Signs: Reviewed and stable  Last Vitals:  Filed Vitals:   11/27/14 1210  BP: 97/52  Pulse: 50  Temp: 36.8 C  Resp: 14    Complications: No apparent anesthesia complications

## 2014-11-27 NOTE — Op Note (Signed)
Preoperative diagnosis:  1.  Intrauterine pregnancy at 1757w0d  weeks gestation                                         2.  Previus Caesarean section                                         3.  Declines TOL   Postoperative diagnosis:  Same as above   Procedure:  Repeat cesarean section  Surgeon:  Lazaro ArmsLuther H Guage Efferson MD  Assistant:  Dr Loreta AveAcosta  Anesthesia: Spinal  Findings:  Delivery [phone].    Over a low transverse incision was delivered a viable female with Apgars of 8 and 9 weighing 5 lbs. 10 oz. Uterus, tubes and ovaries were all normal.  There were no other significant findings  Description of operation:  Patient was taken to the operating room and placed in the sitting position where she underwent a spinal anesthetic. She was then placed in the supine position with tilt to the left side. When adequate anesthetic level was obtained she was prepped and draped in usual sterile fashion and a Foley catheter was placed. A Pfannenstiel skin incision was made and carried down sharply to the rectus fascia which was scored in the midline extended laterally. The fascia was taken off the muscles both superiorly and without difficulty. The muscles were divided.  The peritoneal cavity was entered.  Bladder blade was placed, no bladder flap was created.  A low transverse hysterotomy incision was made and delivered a viable female  infant at [id number] with Apgars of 8 and 9 weighing5 lbs 10 oz.  Cord pH was obtained and was not performed. The uterus was exteriorized. It was closed in 2 layers, the first being a running interlocking layer and the second being an imbricating layer using 0 monocryl on a CTX needle. There was good resulting hemostasis. The uterus tubes and ovaries were all normal. Peritoneal cavity was irrigated vigorously. The muscles and peritoneum were reapproximated loosely. The fascia was closed using 0 Vicryl in running fashion. Subcutaneous tissue was made hemostatic and irrigated. 266 mg exparel in 60  cc solution was injected into the subcutaneous tissue.  The skin was closed using 4-0 Vicryl on a Keith needle in a subcuticular fashion.  Liquiban was placed for additional wound integrity and to serve as a barrier. Blood loss for the procedure was 700 cc. The patient received a gram of Ancef prophylactically. The patient was taken to the recovery room in good stable condition with all counts being correct x3.  EBL 700 cc  Parker Sawatzky H 11/27/2014 10:21 AM

## 2014-11-28 LAB — CBC
HCT: 23.2 % — ABNORMAL LOW (ref 36.0–46.0)
Hemoglobin: 8.2 g/dL — ABNORMAL LOW (ref 12.0–15.0)
MCH: 35.2 pg — ABNORMAL HIGH (ref 26.0–34.0)
MCHC: 35.3 g/dL (ref 30.0–36.0)
MCV: 99.6 fL (ref 78.0–100.0)
PLATELETS: 228 10*3/uL (ref 150–400)
RBC: 2.33 MIL/uL — ABNORMAL LOW (ref 3.87–5.11)
RDW: 14.1 % (ref 11.5–15.5)
WBC: 11.1 10*3/uL — AB (ref 4.0–10.5)

## 2014-11-28 MED ORDER — NALBUPHINE HCL 10 MG/ML IJ SOLN: 5.0000 mg | INTRAMUSCULAR | Status: DC | PRN

## 2014-11-28 MED ORDER — DIPHENHYDRAMINE HCL 25 MG PO CAPS: 25.0000 mg | ORAL_CAPSULE | ORAL | Status: DC | PRN

## 2014-11-28 MED ORDER — NALBUPHINE HCL 10 MG/ML IJ SOLN
5.0000 mg | INTRAMUSCULAR | Status: DC | PRN
  Filled 2014-11-28: qty 1

## 2014-11-28 MED ORDER — NALBUPHINE HCL 10 MG/ML IJ SOLN: 5.0000 mg | Freq: Once | INTRAMUSCULAR | Status: AC | PRN

## 2014-11-28 MED ORDER — NALBUPHINE HCL 10 MG/ML IJ SOLN
5.0000 mg | Freq: Once | INTRAMUSCULAR | Status: AC | PRN
  Administered 2014-11-28: 5 mg via INTRAVENOUS

## 2014-11-28 MED ORDER — DIPHENHYDRAMINE HCL 50 MG/ML IJ SOLN: 12.5000 mg | INTRAMUSCULAR | Status: DC | PRN

## 2014-11-28 NOTE — Progress Notes (Signed)
Subjective: Postpartum Day 1: Cesarean Delivery Patient reports doing well. She denies CP, SOB, lightheadedness/dizziness. Her pain is currently well controlled    Objective: Vital signs in last 24 hours: Temp:  [97.6 F (36.4 C)-99.3 F (37.4 C)] 97.8 F (36.6 C) (12/31 0500) Pulse Rate:  [50-79] 72 (12/31 0500) Resp:  [14-22] 16 (12/31 0500) BP: (86-111)/(47-63) 104/49 mmHg (12/31 0500) SpO2:  [95 %-100 %] 95 % (12/30 1310)  Physical Exam:  General: alert, cooperative and no distress Lochia: appropriate Uterine Fundus: firm Incision: honeycomb dressing- clean, dry and intact DVT Evaluation: No evidence of DVT seen on physical exam. No cords or calf tenderness.   Recent Labs  11/26/14 1534 11/28/14 0556  HGB 11.5* 8.2*  HCT 33.0* 23.2*    Assessment/Plan: Status post Cesarean section. Doing well postoperatively.  Continue current care. Encourage ambulation  Deriyah Kunath 11/28/2014, 10:37 AM

## 2014-11-28 NOTE — Anesthesia Postprocedure Evaluation (Signed)
  Anesthesia Post-op Note  Patient: Chloe [name]  Procedure(s) Performed: Procedure(s): REPEAT CESAREAN SECTION (N/A)  Patient Location: PACU and Mother/Baby  Anesthesia Type:Spinal  Level of Consciousness: awake, alert , oriented and patient cooperative  Airway and Oxygen Therapy: Patient Spontanous Breathing  Post-op Pain: none  Post-op Assessment: Post-op Vital signs reviewed, Patient's Cardiovascular Status Stable, Respiratory Function Stable, Patent Airway, No signs of Nausea or vomiting, Adequate PO intake, Pain level controlled, No headache, No backache, No residual numbness and No residual motor weakness  Post-op Vital Signs: Reviewed and stable  Last Vitals:  Filed Vitals:   11/28/14 0500  BP: 104/49  Pulse: 72  Temp: 36.6 C  Resp: 16    Complications: No apparent anesthesia complications

## 2014-11-28 NOTE — Addendum Note (Signed)
Addendum  created 11/28/14 [id number] by Orlie Pollenebra R [place]ville Dani, CRNA   Modules edited: Notes Section   Notes Section:  File: [id number]

## 2014-11-28 NOTE — Lactation Note (Signed)
This note was copied from the chart of Girl Chloeanne Torrance. Lactation Consultation Note     Follow up consult with this mom and baby, full term but SGA, weighing 5 1/2 pounds. Mom reports the baby breastfeeding, but not latching deeply. Mom prefers football hold position, so I repositioned her pillows to allow the baby to wrap around mom, and to be nose at mom's nipple. Mom was trying to place nipple in baby's mouth. I showed mom how to bring the baby to her breast for a deeper latch. The baby needed her lower lip flanged, but had strong, rhythmic suckles, and appeared latched well. The baby was at 3% weight loss at 12 hours of life. She is now 5928 hours old, has voided times 3, stools times 5. I feel she must be transferring milk with this output. i also feel her weight loss will be up significantly tonight.  I told mom we may begin a DEP to supplement sthe baby, depending on her weight. I did give mom a manual hand pump and showed ehr how to use it, and a foley cup, showed mom how to hand express and cup feed the baby. She took about 0.5 mls of EBM, but did not seem hungry - a good sign at this time.  Mom knows to call for questions/concerns.   Patient Name: Girl Derrell LollingMelissa Perfetti WGNFA'OToday's Date: 11/28/2014 Reason for consult: Follow-up assessment   Maternal Data    Feeding Feeding Type: Breast Milk Length of feed: 7 min  LATCH Score/Interventions Latch: Grasps breast easily, tongue down, lips flanged, rhythmical sucking. (bottom lip needed to get flanged by me - shoed dad how to do this ) Intervention(s): Skin to skin;Teach feeding cues Intervention(s): Adjust position;Assist with latch;Breast compression  Audible Swallowing: A few with stimulation Intervention(s): Hand expression  Type of Nipple: Flat Intervention(s): No intervention needed;Hand pump (may need to add nipple shiled if weight loss is too high)  Comfort (Breast/Nipple): Soft / non-tender     Hold (Positioning): Assistance  needed to correctly position infant at breast and maintain latch. Intervention(s): Breastfeeding basics reviewed;Support Pillows;Position options;Skin to skin  LATCH Score: 7  Lactation Tools Discussed/Used     Consult Status Consult Status: Follow-up Date: 11/29/14 Follow-up type: In-patient    Alfred LevinsLee, Elster Corbello Anne 11/28/2014, 1:57 PM

## 2014-11-29 ENCOUNTER — Encounter (HOSPITAL_COMMUNITY): Payer: Self-pay | Admitting: Obstetrics & Gynecology

## 2014-11-29 MED ORDER — HYDROCODONE-ACETAMINOPHEN 5-325 MG PO TABS: 1.0000 | ORAL_TABLET | Freq: Four times a day (QID) | ORAL | Status: DC | PRN

## 2014-11-29 NOTE — Discharge Instructions (Signed)

## 2014-11-29 NOTE — Discharge Summary (Signed)
Obstetric Discharge Summary Reason for Admission: cesarean section Prenatal Procedures: none Intrapartum Procedures: cesarean: low cervical, transverse Postpartum Procedures: none Complications-Operative and Postpartum: none HEMOGLOBIN  Date Value Ref Range Status  11/28/2014 8.2* 12.0 - 15.0 g/dL Final    Comment:    REPEATED TO VERIFY DELTA CHECK NOTED    HCT  Date Value Ref Range Status  11/28/2014 23.2* 36.0 - 46.0 % Final    Physical Exam:  General: alert and no distress Lochia: appropriate Uterine Fundus: firm u-2 Incision: healing well, no significant drainage, no dehiscence, no significant erythema DVT Evaluation: No evidence of DVT seen on physical exam.  Discharge Diagnoses: Term Pregnancy-delivered and prior cesarean / s/p repeat cesarean section  Discharge Information: Date: 11/29/2014 Activity: pelvic rest  Birth Control : Vasectomy planned. Discussed with partner Diet: routine Medications: PNV and Vicodin Condition: stable Instructions: refer to practice specific booklet Discharge to: home Follow-up Information    Follow up with Lazaro Arms, MD In 4 weeks.   Specialties:  Obstetrics and Gynecology, Radiology   Why:  For wound re-check   Contact information:   [address] [place] [address] [phone]       Newborn Data: Live born female  Birth Weight: 5 lb 12.1 oz (2610 g) APGAR: 8, 9  Home with mother.  Lucian Baswell V 11/29/2014, 11:06 AM

## 2014-11-29 NOTE — Lactation Note (Signed)
This note was copied from the chart of Chloe Kajsa Tankard. Lactation Consultation Note  Patient Name: Chloe [name] [id number] Date: 11/29/2014  per [facility] RN - Chloe [name] - per mom has decided to switch  to bottle and is familiar wit how to dry up milk.    Maternal Data    Feeding Feeding Type: Bottle Fed - Formula Nipple Type: Slow - flow  LATCH Score/Interventions                      Lactation Tools Discussed/Used     Consult Status      Chloe [name] 11/29/2014, 8:59 AM

## 2014-12-02 ENCOUNTER — Telehealth: Payer: Self-pay | Admitting: Obstetrics and Gynecology

## 2014-12-04 ENCOUNTER — Encounter: Payer: Self-pay | Admitting: Obstetrics & Gynecology

## 2014-12-04 ENCOUNTER — Ambulatory Visit (INDEPENDENT_AMBULATORY_CARE_PROVIDER_SITE_OTHER): Payer: BLUE CROSS/BLUE SHIELD | Admitting: Obstetrics & Gynecology

## 2014-12-04 VITALS — BP 110/80 | Wt 138.0 lb

## 2014-12-04 DIAGNOSIS — Z9889 Other specified postprocedural states: Secondary | ICD-10-CM

## 2014-12-04 NOTE — Telephone Encounter (Signed)
Pt states that something was wrong with her phone. Pt states that she has an appointment today at our office and would bring the Rx with her.

## 2014-12-04 NOTE — Progress Notes (Signed)
Patient ID: Chloe [name], female   DOB: 07-07-1986, 29 y.o.   MRN: [id number]  HPI: Patient returns for routine postoperative follow-up having undergone repeat Caesarean section  on 11/27/2014. The patient's early postoperative recovery while in the hospital was notable for na. Since hospital discharge the patient reports no problems.   Current Outpatient Prescriptions  Medication Sig Dispense Refill  . levothyroxine (SYNTHROID, LEVOTHROID) 75 MCG tablet Take 75 mcg by mouth daily before breakfast.    . acetaminophen (TYLENOL) 500 MG tablet Take 1,000 mg by mouth as needed.     . calcium carbonate (TUMS - DOSED IN MG ELEMENTAL CALCIUM) 500 MG chewable tablet Chew 2-3 tablets by mouth as needed for indigestion or heartburn. Patient takes them up to 10 times daily.    Marland Kitchen. HYDROcodone-acetaminophen (NORCO/VICODIN) 5-325 MG per tablet Take 1 tablet by mouth every 6 (six) hours as needed for moderate pain. May take with ibuprofen 15 tablet 0  . phenylephrine (SUDAFED PE) 10 MG TABS tablet Take 10 mg by mouth every 4 (four) hours as needed.    . Prenatal Vit-Fe Fumarate-FA (PRENATAL MULTIVITAMIN) TABS tablet Take 1 tablet by mouth at bedtime.     No current facility-administered medications for this visit.    Physical Exam: Incision clean dry intct  Diagnostic Tests: nonc  Impression: Normal post op course  Plan: Post partum exam in 5 weeks

## 2015-01-08 ENCOUNTER — Ambulatory Visit (INDEPENDENT_AMBULATORY_CARE_PROVIDER_SITE_OTHER): Payer: BLUE CROSS/BLUE SHIELD | Admitting: Women's Health

## 2015-01-08 ENCOUNTER — Encounter: Payer: Self-pay | Admitting: Women's Health

## 2015-01-08 DIAGNOSIS — E039 Hypothyroidism, unspecified: Secondary | ICD-10-CM

## 2015-01-08 DIAGNOSIS — Z7289 Other problems related to lifestyle: Secondary | ICD-10-CM

## 2015-01-08 DIAGNOSIS — Z8742 Personal history of other diseases of the female genital tract: Secondary | ICD-10-CM

## 2015-01-08 NOTE — Patient Instructions (Signed)
Call counselor to set up an appointment for as soon as possible  NO SEX UNTIL YOU GET YOUR IUD  Levonorgestrel intrauterine device (IUD) What is this medicine? LEVONORGESTREL IUD (LEE voe nor jes trel) is a contraceptive (birth control) device. The device is placed inside the uterus by a healthcare professional. It is used to prevent pregnancy and can also be used to treat heavy bleeding that occurs during your period. Depending on the device, it can be used for 3 to 5 years. This medicine may be used for other purposes; ask your health care provider or pharmacist if you have questions. COMMON BRAND NAME(S): Elveria Royals What should I tell my health care provider before I take this medicine? They need to know if you have any of these conditions: -abnormal Pap smear -cancer of the breast, uterus, or cervix -diabetes -endometritis -genital or pelvic infection now or in the past -have more than one sexual partner or your partner has more than one partner -heart disease -history of an ectopic or tubal pregnancy -immune system problems -IUD in place -liver disease or tumor -problems with blood clots or take blood-thinners -use intravenous drugs -uterus of unusual shape -vaginal bleeding that has not been explained -an unusual or allergic reaction to levonorgestrel, other hormones, silicone, or polyethylene, medicines, foods, dyes, or preservatives -pregnant or trying to get pregnant -breast-feeding How should I use this medicine? This device is placed inside the uterus by a health care professional. Talk to your pediatrician regarding the use of this medicine in children. Special care may be needed. Overdosage: If you think you have taken too much of this medicine contact a poison control center or emergency room at once. NOTE: This medicine is only for you. Do not share this medicine with others. What if I miss a dose? This does not apply. What may interact with this  medicine? Do not take this medicine with any of the following medications: -amprenavir -bosentan -fosamprenavir This medicine may also interact with the following medications: -aprepitant -barbiturate medicines for inducing sleep or treating seizures -bexarotene -griseofulvin -medicines to treat seizures like carbamazepine, ethotoin, felbamate, oxcarbazepine, phenytoin, topiramate -modafinil -pioglitazone -rifabutin -rifampin -rifapentine -some medicines to treat HIV infection like atazanavir, indinavir, lopinavir, nelfinavir, tipranavir, ritonavir -St. John's wort -warfarin This list may not describe all possible interactions. Give your health care provider a list of all the medicines, herbs, non-prescription drugs, or dietary supplements you use. Also tell them if you smoke, drink alcohol, or use illegal drugs. Some items may interact with your medicine. What should I watch for while using this medicine? Visit your doctor or health care professional for regular check ups. See your doctor if you or your partner has sexual contact with others, becomes HIV positive, or gets a sexual transmitted disease. This product does not protect you against HIV infection (AIDS) or other sexually transmitted diseases. You can check the placement of the IUD yourself by reaching up to the top of your vagina with clean fingers to feel the threads. Do not pull on the threads. It is a good habit to check placement after each menstrual period. Call your doctor right away if you feel more of the IUD than just the threads or if you cannot feel the threads at all. The IUD may come out by itself. You may become pregnant if the device comes out. If you notice that the IUD has come out use a backup birth control method like condoms and call your health care provider. Using  tampons will not change the position of the IUD and are okay to use during your period. What side effects may I notice from receiving this  medicine? Side effects that you should report to your doctor or health care professional as soon as possible: -allergic reactions like skin rash, itching or hives, swelling of the face, lips, or tongue -fever, flu-like symptoms -genital sores -high blood pressure -no menstrual period for 6 weeks during use -pain, swelling, warmth in the leg -pelvic pain or tenderness -severe or sudden headache -signs of pregnancy -stomach cramping -sudden shortness of breath -trouble with balance, talking, or walking -unusual vaginal bleeding, discharge -yellowing of the eyes or skin Side effects that usually do not require medical attention (report to your doctor or health care professional if they continue or are bothersome): -acne -breast pain -change in sex drive or performance -changes in weight -cramping, dizziness, or faintness while the device is being inserted -headache -irregular menstrual bleeding within first 3 to 6 months of use -nausea This list may not describe all possible side effects. Call your doctor for medical advice about side effects. You may report side effects to FDA at 1-800-FDA-1088. Where should I keep my medicine? This does not apply. NOTE: This sheet is a summary. It may not cover all possible information. If you have questions about this medicine, talk to your doctor, pharmacist, or health care provider.  2015, Elsevier/Gold Standard. (2011-12-16 13:54:04)

## 2015-01-08 NOTE — Progress Notes (Signed)
Patient ID: Chloe CongressMelissa A Amyx, female   DOB: 1986/03/14, 29 y.o.   MRN: [id number] Subjective:    Chloe [name] is a 29 y.o. 132P2002 Caucasian female who presents for a postpartum visit. She is 6 weeks postpartum following a repeat cesarean section, low transverse incision at 39 gestational weeks. Anesthesia: spinal. I have fully reviewed the prenatal and intrapartum course. Postpartum course has been uncomplicated. Baby's course has been uncomplicated- doesn't sleep well. Baby is feeding by breast x 2d now bottle. Bleeding no bleeding. Bowel function is normal. Bladder function is normal. Patient is sexually active. Last sexual activity: 2/1. Contraception method is none and desires IUD. Postpartum depression screening: positive. Score 11.  Just overwhelmed d/t lack of sleep from baby not sleeping well. Still finds joy in things she used to. Eating. Has h/o cutting, last cut ~3 months ago, thought about it last night, but didn't. Has Librarian, academicChristian counselor [place], has not seen her since ~456months of pregnancy, supposed to go q 1-2wks. No SI/HI/II. Last pap 02/2014 and was ASCUS/HPV, colpo HPV atypia, needs repeat pap. Taking synthroid, last TSH in June.   The following portions of the patient's history were reviewed and updated as appropriate: allergies, current medications, past medical history, past surgical history and problem list.  Review of Systems Pertinent items are noted in HPI.   Filed Vitals:   01/08/15 1436  BP: 110/62  Height: 4\' 8"  (1.422 m)  Weight: 128 lb (58.06 kg)   Patient's last menstrual period was 12/09/2014.  Objective:   General:  alert, cooperative and no distress   Breasts:  deferred, no complaints  Lungs: clear to auscultation bilaterally  Heart:  regular rate and rhythm  Abdomen: soft, nontender, incision well healed   Vulva: normal  Vagina: normal vagina  Cervix:  closed  Corpus: Well-involuted  Adnexa:  Non-palpable  Rectal Exam: No hemorrhoids         Assessment:   Postpartum exam 6 wks s/p RLTCS Bottlefeeding Depression screening Self-cutting, last ~3 months ago Abnormal pap/colpo Contraception counseling   Plan:  Call counselor in [place] to get appt w/ her ASAP Contraception: abstinence and IUD placement after pap Follow up in: 1 week for pap, then plan on the next day for IUD insertion or earlier if needed TSH today Continue synthroid  Marge DuncansBooker, Montarius Kitagawa Randall CNM, [facility] 01/08/2015 2:48 PM

## 2015-01-09 LAB — TSH: TSH: 0.019 u[IU]/mL — ABNORMAL LOW (ref 0.450–4.500)

## 2015-01-15 ENCOUNTER — Ambulatory Visit (INDEPENDENT_AMBULATORY_CARE_PROVIDER_SITE_OTHER): Payer: BLUE CROSS/BLUE SHIELD | Admitting: Women's Health

## 2015-01-15 ENCOUNTER — Encounter: Payer: Self-pay | Admitting: Women's Health

## 2015-01-15 ENCOUNTER — Other Ambulatory Visit (HOSPITAL_COMMUNITY)
Admission: RE | Admit: 2015-01-15 | Discharge: 2015-01-15 | Disposition: A | Discharge status: Home / Self Care | Payer: BLUE CROSS/BLUE SHIELD | Source: Ambulatory Visit | Attending: Obstetrics & Gynecology | Admitting: Obstetrics & Gynecology

## 2015-01-15 VITALS — BP 102/74 | Ht <= 58 in | Wt 128.0 lb

## 2015-01-15 DIAGNOSIS — Z8742 Personal history of other diseases of the female genital tract: Secondary | ICD-10-CM

## 2015-01-15 DIAGNOSIS — E039 Hypothyroidism, unspecified: Secondary | ICD-10-CM

## 2015-01-15 DIAGNOSIS — Z124 Encounter for screening for malignant neoplasm of cervix: Secondary | ICD-10-CM

## 2015-01-15 DIAGNOSIS — Z01419 Encounter for gynecological examination (general) (routine) without abnormal findings: Secondary | ICD-10-CM | POA: Insufficient documentation

## 2015-01-15 MED ORDER — LEVOTHYROXINE SODIUM 50 MCG PO TABS: 50.0000 ug | ORAL_TABLET | Freq: Every day | ORAL | Status: DC

## 2015-01-15 NOTE — Patient Instructions (Addendum)
No sex until IUD placed Decrease Synthroid to 50mcg daily and have TSH rechecked with us or your family doctor in 8 weeks

## 2015-01-15 NOTE — Progress Notes (Signed)
Subjective:   Chloe [name] is a 29 y.o. 352P2002 Caucasian female here for a routine well-woman exam. She is 7 weeks s/p RLTCS. She is bottlefeeding.  Patient's last menstrual period was 01/09/2015.   She was seen last week on 2/10 for pp visit. At that time her EPDS was 11, denies depression, just feeling overwhelmed d/t lack of sleep, and thoughts of self-cutting as she has a history of this. Has scheduled an appt for next week w/ her counselor in [place]. Denies SI/HI/II.  H/O hypothyroidism, currently on synthroid 75mcg, TSH on 2/10: 0.019 Last sex 12/30/14, is wanting IUD asap.  Current complaints: none PCP: [facility]        The following portions of the patient's history were reviewed and updated as appropriate: allergies, current medications, past family history, past medical history, past social history, past surgical history and problem list.  Past Medical History Past Medical History  Diagnosis Date  . Thyroid disease   . Hypothyroidism   . Anxiety     Hx - no meds  . Heartburn during pregnancy   . HPV (human papilloma virus) anogenital infection     Past Surgical History Past Surgical History  Procedure Laterality Date  . Cesarean section      [place]  . Tooth extraction    . Cesarean section N/A 11/27/2014    Procedure: REPEAT CESAREAN SECTION;  Surgeon: Lazaro ArmsLuther H Eure, MD;  Location: [facility];  Service: Obstetrics;  Laterality: N/A;    Gynecologic History [id number]  Patient's last menstrual period was 01/09/2015. Contraception: abstinence and plans for IUD asap Last Pap: 02/2014. Results were: ASCUS/HPV w/ HPV atypia colpo, needs repeat pap Last mammogram: never. Results were: n/a Last TCS: never  Obstetric History OB History  Gravida Para Term Preterm AB SAB TAB Ectopic Multiple Living  2 2 2       0 2    # Outcome Date GA Lbr Len/2nd Weight Sex Delivery Anes PTL Lv  2 Term 11/27/14 [id number]  5 lb 12.1 oz (2.61 kg) F CS-LTranv Spinal  Y  1 Term 01/31/13 [id number]  6 lb 1  oz (2.75 kg) F CS-Unspec   Y      Current Medications Current Outpatient Prescriptions on File Prior to Visit  Medication Sig Dispense Refill  . acetaminophen (TYLENOL) 500 MG tablet Take 1,000 mg by mouth as needed.     . calcium carbonate (TUMS - DOSED IN MG ELEMENTAL CALCIUM) 500 MG chewable tablet Chew 2-3 tablets by mouth as needed for indigestion or heartburn. Patient takes them up to 10 times daily.    Marland Kitchen. HYDROcodone-acetaminophen (NORCO/VICODIN) 5-325 MG per tablet Take 1 tablet by mouth every 6 (six) hours as needed for moderate pain. May take with ibuprofen (Patient not taking: Reported on 01/08/2015) 15 tablet 0  . phenylephrine (SUDAFED PE) 10 MG TABS tablet Take 10 mg by mouth every 4 (four) hours as needed.    . Prenatal Vit-Fe Fumarate-FA (PRENATAL MULTIVITAMIN) TABS tablet Take 1 tablet by mouth at bedtime.     No current facility-administered medications on file prior to visit.    Review of Systems Patient denies any headaches, blurred vision, shortness of breath, chest pain, abdominal pain, problems with bowel movements, urination, or intercourse.  Objective:  BP 102/74 mmHg  Ht 4\' 8"  (1.422 m)  Wt 128 lb (58.06 kg)  BMI 28.71 kg/m2  LMP 01/09/2015  Breastfeeding? No Physical Exam  General:  Well developed, well nourished, no acute distress. She is  alert and oriented x3. Skin:  Warm and dry. No current cutting marks.  Neck:  Midline trachea, no thyromegaly or nodules Cardiovascular: Regular rate and rhythm, no murmur heard Lungs:  Effort normal, all lung fields clear to auscultation bilaterally Breasts:  No dominant palpable mass, retraction, or nipple discharge Abdomen:  Soft, non tender, no hepatosplenomegaly or masses. C/S incision healing well.  Pelvic:  External genitalia is normal in appearance.  The vagina is normal in appearance. The cervix is bulbous, no CMT.  Thin prep pap is done w/ reflex HR HPV cotesting. Uterus is felt to be normal size, shape, and  contour.  No adnexal masses or tenderness noted. Extremities:  No swelling or varicosities noted Psych:  She has a normal mood and affect  Assessment:   Healthy well-woman exam Hypothyroidism, currently w/ low TSH on synthroid H/O abnormal pap H/O self-cutting 7wks s/p RLTCS Bottlefeeding  Plan:  Decrease synthroid to , will recheck TSH in 8wks- can do w/ [place] or PCP- pt wants [place] to check it Keep appt w/ counselor for next week Remain abstinent until IUD placement F/U on Monday for UPT & Liletta IUD insertion Mammogram @ 29yo or sooner if problems Colonoscopy  or sooner if problems  Marge Duncans CNM, [facility] 01/15/2015 4:14 PM

## 2015-01-20 LAB — CYTOLOGY - PAP

## 2015-01-21 ENCOUNTER — Encounter: Payer: Self-pay | Admitting: Advanced Practice Midwife

## 2015-01-21 ENCOUNTER — Ambulatory Visit: Payer: BLUE CROSS/BLUE SHIELD | Admitting: Advanced Practice Midwife

## 2015-01-21 ENCOUNTER — Ambulatory Visit (INDEPENDENT_AMBULATORY_CARE_PROVIDER_SITE_OTHER): Payer: BLUE CROSS/BLUE SHIELD | Admitting: Advanced Practice Midwife

## 2015-01-21 VITALS — BP 108/72 | HR 87 | Ht <= 58 in | Wt 129.5 lb

## 2015-01-21 DIAGNOSIS — Z3202 Encounter for pregnancy test, result negative: Secondary | ICD-10-CM

## 2015-01-21 DIAGNOSIS — R87810 Cervical high risk human papillomavirus (HPV) DNA test positive: Principal | ICD-10-CM

## 2015-01-21 DIAGNOSIS — Z30014 Encounter for initial prescription of intrauterine contraceptive device: Secondary | ICD-10-CM

## 2015-01-21 DIAGNOSIS — R87610 Atypical squamous cells of undetermined significance on cytologic smear of cervix (ASC-US): Secondary | ICD-10-CM

## 2015-01-21 DIAGNOSIS — Z30430 Encounter for insertion of intrauterine contraceptive device: Secondary | ICD-10-CM

## 2015-01-21 LAB — POCT URINE PREGNANCY: Preg Test, Ur: NEGATIVE

## 2015-01-21 NOTE — Progress Notes (Signed)
Chloe [name] is a 29 y.o. year old Caucasian female Gravida 2 Para 2  who presents for placement of a Lyletta IUD. Her LMP was 01/19/15  and her pregnancy test today is negative.    The risks and benefits of the method and placement have been thouroughly reviewed with the patient and all questions were answered.  Specifically the patient is aware of failure rate of 11/998, expulsion of the IUD and of possible perforation.  The patient is aware of irregular bleeding due to the method and understands the incidence of irregular bleeding diminishes with time. She is also aware that as of today, the Candise CheLyletta is approved for 3 years, with the expectation (although not guarantee) that it will receive 5 year, and most likely 7 year, approval).  Time out was performed.  A Graves speculum was placed.  The cervix was prepped using Betadine. The uterus was found to be neutral and it sounded to 7 cm.  The cervix was grasped with a tenaculum and the IUD was inserted to 7 cm.  It was pulled back 1 cm and the IUD was disengaged.  The strings were trimmed to 3 cm.  Sonogram was performed and the proper placement of the IUD was verified.  The patient was instructed on signs and symptoms of infection and to check for the strings after each menses or each month.  The patient is to refrain from intercourse for 3 days.  The patient is scheduled for a return appointment after her first menses or 4 weeks.  CRESENZO-DISHMAN,Genna Casimir 01/21/2015 3:27 PM

## 2015-01-30 ENCOUNTER — Ambulatory Visit: Payer: BLUE CROSS/BLUE SHIELD | Admitting: Advanced Practice Midwife

## 2015-02-18 ENCOUNTER — Ambulatory Visit: Payer: BLUE CROSS/BLUE SHIELD | Admitting: Advanced Practice Midwife

## 2015-02-19 ENCOUNTER — Ambulatory Visit (INDEPENDENT_AMBULATORY_CARE_PROVIDER_SITE_OTHER): Payer: BLUE CROSS/BLUE SHIELD | Admitting: Advanced Practice Midwife

## 2015-02-19 ENCOUNTER — Encounter: Payer: Self-pay | Admitting: Advanced Practice Midwife

## 2015-02-19 VITALS — BP 96/70 | HR 72 | Ht <= 58 in | Wt 126.5 lb

## 2015-02-19 DIAGNOSIS — Z30431 Encounter for routine checking of intrauterine contraceptive device: Secondary | ICD-10-CM | POA: Insufficient documentation

## 2015-02-19 NOTE — Progress Notes (Signed)
[facility]  Patient name: Chloe CongressMelissa A Conroy MRN [id number]  Date of birth: 28-Mar-1986  CC & HPI:  Caleen JobsMelissa A Bollier is a 29 y.o. Caucasian female presenting today for IUD checkup.  She had a Lyletta IUD placed 4 weeks ago.  She has had off and on bleeding, very few cramps.  Has not been able to get to cx to find strings. Says Husband is not aware of strings during intercourse  Pertinent History Reviewed:  Medical & Surgical Hx:   Past Medical History  Diagnosis Date  . Thyroid disease   . Hypothyroidism   . Anxiety     Hx - no meds  . Heartburn during pregnancy   . HPV (human papilloma virus) anogenital infection    Past Surgical History  Procedure Laterality Date  . Cesarean section      [place]  . Tooth extraction    . Cesarean section N/A 11/27/2014    Procedure: REPEAT CESAREAN SECTION;  Surgeon: Lazaro ArmsLuther H Eure, MD;  Location: [facility];  Service: Obstetrics;  Laterality: N/A;   Family History  Problem Relation Age of Onset  . Diabetes Paternal Grandfather   . Diabetes Paternal Grandmother   . Epilepsy Father   . Breast cancer Mother   . Thyroid disease Mother     Current outpatient prescriptions:  .  acetaminophen (TYLENOL) 500 MG tablet, Take 1,000 mg by mouth as needed. , Disp: , Rfl:  .  levothyroxine (SYNTHROID, LEVOTHROID) 75 MCG tablet, Take 75 mcg by mouth daily., Disp: , Rfl:  .  levothyroxine (SYNTHROID, LEVOTHROID) 50 MCG tablet, Take 50 mcg by mouth daily before breakfast. , Disp: , Rfl: 0 Social History: Reviewed -  reports that she has been smoking Cigarettes.  She has a 2 pack-year smoking history. She has never used smokeless tobacco.  Review of Systems:   Constitutional: Negative for fever and chills Eyes: Negative for visual disturbances Respiratory: Negative for shortness of breath, dyspnea Cardiovascular: Negative for chest pain or palpitations  Gastrointestinal: Negative for vomiting, diarrhea and constipation; no abdominal  pain Genitourinary: Negative for dysuria and urgency, vaginal irritation or itching Musculoskeletal: Negative for back pain, joint pain, myalgias  Neurological: Negative for dizziness and headaches    Objective Findings:  Vitals: BP 96/70 mmHg  Pulse 72  Ht 4\' 8"  (1.422 m)  Wt 126 lb 8 oz (57.38 kg)  BMI 28.38 kg/m2  LMP 02/17/2015  Physical Examination: General appearance - alert, well appearing, and in no distress Mental status - oriented to person, place, and time Abdomen - soft, nontender, nondistended, no masses or organomegaly Pelvic - SSE:  Strings visible.  Bedside US reveals proper placement of IUD near fundus Extremities - peripheral pulses normal, no pedal edema, no clubbing or cyanosis  No results found for this or any previous visit (from the past 24 hour(s)).      Assessment & Plan:  A:   IUD check, normal P:  Continue to check strings q month, may have husband check for her if still unable to get to cervix  F/U prn for problems   CRESENZO-DISHMAN,Robey Massmann CNM 02/19/2015 1:35 PM

## 2015-08-07 ENCOUNTER — Other Ambulatory Visit (INDEPENDENT_AMBULATORY_CARE_PROVIDER_SITE_OTHER): Payer: BLUE CROSS/BLUE SHIELD

## 2015-08-07 ENCOUNTER — Ambulatory Visit (INDEPENDENT_AMBULATORY_CARE_PROVIDER_SITE_OTHER): Payer: BLUE CROSS/BLUE SHIELD | Admitting: Adult Health

## 2015-08-07 ENCOUNTER — Other Ambulatory Visit: Payer: Self-pay | Admitting: Adult Health

## 2015-08-07 ENCOUNTER — Encounter: Payer: Self-pay | Admitting: Adult Health

## 2015-08-07 VITALS — BP 92/70 | HR 76 | Ht <= 58 in | Wt 125.5 lb

## 2015-08-07 DIAGNOSIS — R102 Pelvic and perineal pain: Secondary | ICD-10-CM

## 2015-08-07 DIAGNOSIS — N8320 Unspecified ovarian cysts: Secondary | ICD-10-CM

## 2015-08-07 DIAGNOSIS — Z3202 Encounter for pregnancy test, result negative: Secondary | ICD-10-CM | POA: Diagnosis not present

## 2015-08-07 DIAGNOSIS — N83201 Unspecified ovarian cyst, right side: Secondary | ICD-10-CM

## 2015-08-07 DIAGNOSIS — Z975 Presence of (intrauterine) contraceptive device: Secondary | ICD-10-CM

## 2015-08-07 DIAGNOSIS — N390 Urinary tract infection, site not specified: Secondary | ICD-10-CM

## 2015-08-07 DIAGNOSIS — T8384XS Pain from genitourinary prosthetic devices, implants and grafts, sequela: Secondary | ICD-10-CM

## 2015-08-07 HISTORY — DX: Unspecified ovarian cyst, right side: N83.201

## 2015-08-07 HISTORY — DX: Pelvic and perineal pain: R10.2

## 2015-08-07 HISTORY — DX: Urinary tract infection, site not specified: N39.0

## 2015-08-07 LAB — POCT URINALYSIS DIPSTICK
Glucose, UA: NEGATIVE
Leukocytes, UA: NEGATIVE
Nitrite, UA: POSITIVE
RBC UA: NEGATIVE

## 2015-08-07 LAB — POCT URINE PREGNANCY: PREG TEST UR: NEGATIVE

## 2015-08-07 MED ORDER — IBUPROFEN 800 MG PO TABS: 800.0000 mg | ORAL_TABLET | Freq: Three times a day (TID) | ORAL | Status: DC | PRN

## 2015-08-07 MED ORDER — HYDROCODONE-ACETAMINOPHEN 5-325 MG PO TABS: 1.0000 | ORAL_TABLET | Freq: Four times a day (QID) | ORAL | Status: DC | PRN

## 2015-08-07 MED ORDER — SULFAMETHOXAZOLE-TRIMETHOPRIM 800-160 MG PO TABS: 1.0000 | ORAL_TABLET | Freq: Two times a day (BID) | ORAL | Status: DC

## 2015-08-07 NOTE — Progress Notes (Signed)
Subjective:     Patient ID: Chloe [name], female   DOB: Apr 03, 1986, 29 y.o.   MRN: [id number]  HPI Chloe [name] is a 29 year old white female, worked in today, complaining of severe onset of pelvic/abdominal pain that radiated to right shoulder, it happened after sex, was a 29 and had nausea, may be mildly constipated, has IUD.Got IUD in February.  Review of Systems Patient denies any headaches, hearing loss, fatigue, blurred vision, shortness of breath, chest pain, problems with bowel movements, urination, or intercourse. No joint pain or mood swings.See HPI for positives. Reviewed past medical,surgical, social and family history. Reviewed medications and allergies.     Objective:   Physical Exam BP 92/70 mmHg  Pulse 76  Ht 4' 8"  (1.422 m)  Wt 125 lb 8 oz (56.926 kg)  BMI 28.15 kg/m2  Breastfeeding? No UPT negative,urine + nitrates, trace protein, Skin warm and dry, abdomen soft and tender,through out, no murphy's sign, no HSM has bowels sound in all 4 quadrants,Pelvic: external genitalia is normal in appearance no lesions, vagina: mucous discharge without odor,urethra has no lesions or masses noted, cervix:smooth and bulbous,+IUD strings uterus: normal size, shape and contour, tender, no masses felt, adnexa: no masses, bilateral tenderness noted. Bladder is non tender and no masses felt. No rebound tenderness.US showed right ovarian cyst and +CDS fluid, GB looked normal, IUD was in place.Says pain now a 1, discussed the cyst and UTI with her, will get labs and follow up in 1 week, will get F/U US in 4-6 weeks to assess ovarian cyst.Face time 25 minutes with 50 % counseling.    Assessment:     Pelvic pain UTI Right ovarian cyst IUD in place     Plan:    UA C&S  Check CBC,CMP and ESR Gyn [place] today Rx septra ds 1 bid x 7 days Rx motrin 800 mg #60 1 every 8 hours prn pain with 1 refill Rx norco 5-325 mg #15 1 every 6 hours prn severe pain, no refill Follow up in `1 week Push fluids, no  sex Review handout on ovarian cyst and UTI   If increase in pain or fever or vomiting go to ER

## 2015-08-07 NOTE — Patient Instructions (Addendum)
Ovarian Cyst An ovarian cyst is a fluid-filled sac that forms on an ovary. The ovaries are small organs that produce eggs in women. Various types of cysts can form on the ovaries. Most are not cancerous. Many do not cause problems, and they often go away on their own. Some may cause symptoms and require treatment. Common types of ovarian cysts include:  Functional cysts--These cysts may occur every month during the menstrual cycle. This is normal. The cysts usually go away with the next menstrual cycle if the woman does not get pregnant. Usually, there are no symptoms with a functional cyst.  Endometrioma cysts--These cysts form from the tissue that lines the uterus. They are also called "chocolate cysts" because they become filled with blood that turns brown. This type of cyst can cause pain in the lower abdomen during intercourse and with your menstrual period.  Cystadenoma cysts--This type develops from the cells on the outside of the ovary. These cysts can get very big and cause lower abdomen pain and pain with intercourse. This type of cyst can twist on itself, cut off its blood supply, and cause severe pain. It can also easily rupture and cause a lot of pain.  Dermoid cysts--This type of cyst is sometimes found in both ovaries. These cysts may contain different kinds of body tissue, such as skin, teeth, hair, or cartilage. They usually do not cause symptoms unless they get very big.  Theca lutein cysts--These cysts occur when too much of a certain hormone (human chorionic gonadotropin) is produced and overstimulates the ovaries to produce an egg. This is most common after procedures used to assist with the conception of a baby (in vitro fertilization). CAUSES   Fertility drugs can cause a condition in which multiple large cysts are formed on the ovaries. This is called ovarian hyperstimulation syndrome.  A condition called polycystic ovary syndrome can cause hormonal imbalances that can lead to  nonfunctional ovarian cysts. SIGNS AND SYMPTOMS  Many ovarian cysts do not cause symptoms. If symptoms are present, they may include:  Pelvic pain or pressure.  Pain in the lower abdomen.  Pain during sexual intercourse.  Increasing girth (swelling) of the abdomen.  Abnormal menstrual periods.  Increasing pain with menstrual periods.  Stopping having menstrual periods without being pregnant. DIAGNOSIS  These cysts are commonly found during a routine or annual pelvic exam. Tests may be ordered to find out more about the cyst. These tests may include:  Ultrasound.  X-ray of the pelvis.  CT scan.  MRI.  Blood tests. TREATMENT  Many ovarian cysts go away on their own without treatment. Your health care provider may want to check your cyst regularly for 2-3 months to see if it changes. For women in menopause, it is particularly important to monitor a cyst closely because of the higher rate of ovarian cancer in menopausal women. When treatment is needed, it may include any of the following:  A procedure to drain the cyst (aspiration). This may be done using a long needle and ultrasound. It can also be done through a laparoscopic procedure. This involves using a thin, lighted tube with a tiny camera on the end (laparoscope) inserted through a small incision.  Surgery to remove the whole cyst. This may be done using laparoscopic surgery or an open surgery involving a larger incision in the lower abdomen.  Hormone treatment or birth control pills. These methods are sometimes used to help dissolve a cyst. HOME CARE INSTRUCTIONS   Only take over-the-counter   or prescription medicines as directed by your health care provider.  Follow up with your health care provider as directed.  Get regular pelvic exams and Pap tests. SEEK MEDICAL CARE IF:   Your periods are late, irregular, or painful, or they stop.  Your pelvic pain or abdominal pain does not go away.  Your abdomen becomes  larger or swollen.  You have pressure on your bladder or trouble emptying your bladder completely.  You have pain during sexual intercourse.  You have feelings of fullness, pressure, or discomfort in your stomach.  You lose weight for no apparent reason.  You feel generally ill. YUrinary Tract Infection Urinary tract infections (UTIs) can develop anywhere along your urinary tract. Your urinary tract is your body's drainage system for removing wastes and extra water. Your urinary tract includes two kidneys, two ureters, a bladder, and a urethra. Your kidneys are a pair of bean-shaped organs. Each kidney is about the size of your fist. They are located below your ribs, one on each side of your spine. CAUSES Infections are caused by microbes, which are microscopic organisms, including fungi, viruses, and bacteria. These organisms are so small that they can only be seen through a microscope. Bacteria are the microbes that most commonly cause UTIs. SYMPTOMS  Symptoms of UTIs may vary by age and gender of the patient and by the location of the infection. Symptoms in young women typically include a frequent and intense urge to urinate and a painful, burning feeling in the bladder or urethra during urination. Older women and men are more likely to be tired, shaky, and weak and have muscle aches and abdominal pain. A fever may mean the infection is in your kidneys. Other symptoms of a kidney infection include pain in your back or sides below the ribs, nausea, and vomiting. DIAGNOSIS To diagnose a UTI, your caregiver will ask you about your symptoms. Your caregiver also will ask to provide a urine sample. The urine sample will be tested for bacteria and white blood cells. White blood cells are made by your body to help fight infection. TREATMENT  Typically, UTIs can be treated with medication. Because most UTIs are caused by a bacterial infection, they usually can be treated with the use of antibiotics.  The choice of antibiotic and length of treatment depend on your symptoms and the type of bacteria causing your infection. HOME CARE INSTRUCTIONS If you were prescribed antibiotics, take them exactly as your caregiver instructs you. Finish the medication even if you feel better after you have only taken some of the medication. Drink enough water and fluids to keep your urine clear or pale yellow. Avoid caffeine, tea, and carbonated beverages. They tend to irritate your bladder. Empty your bladder often. Avoid holding urine for long periods of time. Empty your bladder before and after sexual intercourse. After a bowel movement, women should cleanse from front to back. Use each tissue only once. SEEK MEDICAL CARE IF:  You have back pain. You develop a fever. Your symptoms do not begin to resolve within 3 days. SEEK IMMEDIATE MEDICAL CARE IF:  You have severe back pain or lower abdominal pain. You develop chills. You have nausea or vomiting. You have continued burning or discomfort with urination. MAKE SURE YOU:  Understand these instructions. Will watch your condition. Will get help right away if you are not doing well or get worse. Document Released: 08/25/2005 Document Revised: 05/16/2012 Document Reviewed: 12/24/2011 [facility] Patient Information 2015 [place], [place]. This information is not  intended to replace advice given to you by your health care provider. Make sure you discuss any questions you have with your health care provider.  ou become constipated.  You lose your appetite.  You develop acne.  You have an increase in body and facial hair.  You are gaining weight, without changing your exercise and eating habits.  You think you are pregnant. SEEK IMMEDIATE MEDICAL CARE IF:   You have increasing abdominal pain.  You feel sick to your stomach (nauseous), and you throw up (vomit).  You develop a fever that comes on suddenly.  You have abdominal pain during a bowel  movement.  Your menstrual periods become heavier than usual. MAKE SURE YOU:  Understand these instructions.  Will watch your condition.  Will get help right away if you are not doing well or get worse. Document Released: 11/15/2005 Document Revised: 11/20/2013 Document Reviewed: 07/23/2013 [facility] Patient Information 2015 [place], [place]. This information is not intended to replace advice given to you by your health care provider. Make sure you discuss any questions you have with your health care provider. Push fluids Take septra ds  Follow up in 1 week If fever,increased pain or vomiting go to ER

## 2015-08-07 NOTE — Progress Notes (Addendum)
US PELVIC TV: normal anteverted uterus w/IUD centrally located w/in the endometrium,EEC .80mm,normal lt ov,rt ov :  4 x 2.4 x 2.4 cm hemorrhagic cyst,moderate amount of simple cul de sac fluid

## 2015-08-08 ENCOUNTER — Telehealth: Payer: Self-pay | Admitting: Adult Health

## 2015-08-08 LAB — COMPREHENSIVE METABOLIC PANEL
A/G RATIO: 1.7 (ref 1.1–2.5)
ALK PHOS: 60 IU/L (ref 39–117)
ALT: 13 IU/L (ref 0–32)
AST: 15 IU/L (ref 0–40)
Albumin: 4 g/dL (ref 3.5–5.5)
BUN / CREAT RATIO: 19 (ref 8–20)
BUN: 11 mg/dL (ref 6–20)
Bilirubin Total: 0.4 mg/dL (ref 0.0–1.2)
CHLORIDE: 101 mmol/L (ref 97–108)
CO2: 22 mmol/L (ref 18–29)
Calcium: 8.9 mg/dL (ref 8.7–10.2)
Creatinine, Ser: 0.57 mg/dL (ref 0.57–1.00)
GFR calc Af Amer: 147 mL/min/{1.73_m2} (ref >59)
GFR calc non Af Amer: 127 mL/min/{1.73_m2} (ref >59)
GLOBULIN, TOTAL: 2.3 g/dL (ref 1.5–4.5)
Glucose: 79 mg/dL (ref 65–99)
Potassium: 4 mmol/L (ref 3.5–5.2)
SODIUM: 139 mmol/L (ref 134–144)
Total Protein: 6.3 g/dL (ref 6.0–8.5)

## 2015-08-08 LAB — URINALYSIS, ROUTINE W REFLEX MICROSCOPIC
Bilirubin, UA: NEGATIVE
GLUCOSE, UA: NEGATIVE
Ketones, UA: NEGATIVE
Nitrite, UA: POSITIVE — AB
Protein, UA: NEGATIVE
RBC, UA: NEGATIVE
Specific Gravity, UA: 1.022 (ref 1.005–1.030)
Urobilinogen, Ur: 0.2 mg/dL (ref 0.2–1.0)
pH, UA: 6 (ref 5.0–7.5)

## 2015-08-08 LAB — CBC
Hematocrit: 41.5 % (ref 34.0–46.6)
Hemoglobin: 13.7 g/dL (ref 11.1–15.9)
MCH: 32.4 pg (ref 26.6–33.0)
MCHC: 33 g/dL (ref 31.5–35.7)
MCV: 98 fL — AB (ref 79–97)
Platelets: 327 10*3/uL (ref 150–379)
RBC: 4.23 x10E6/uL (ref 3.77–5.28)
RDW: 12.5 % (ref 12.3–15.4)
WBC: 7.5 10*3/uL (ref 3.4–10.8)

## 2015-08-08 LAB — MICROSCOPIC EXAMINATION: CASTS: NONE SEEN /LPF

## 2015-08-08 LAB — SEDIMENTATION RATE: SED RATE: 6 mm/h (ref 0–32)

## 2015-08-08 NOTE — Telephone Encounter (Signed)
Left message about labs, and urine

## 2015-08-09 LAB — URINE CULTURE

## 2015-08-14 ENCOUNTER — Ambulatory Visit (INDEPENDENT_AMBULATORY_CARE_PROVIDER_SITE_OTHER): Payer: BLUE CROSS/BLUE SHIELD | Admitting: Adult Health

## 2015-08-14 ENCOUNTER — Encounter: Payer: Self-pay | Admitting: Adult Health

## 2015-08-14 VITALS — BP 110/78 | HR 92 | Ht <= 58 in | Wt 125.0 lb

## 2015-08-14 DIAGNOSIS — Z1389 Encounter for screening for other disorder: Secondary | ICD-10-CM

## 2015-08-14 DIAGNOSIS — R102 Pelvic and perineal pain: Secondary | ICD-10-CM | POA: Diagnosis not present

## 2015-08-14 DIAGNOSIS — N8320 Unspecified ovarian cysts: Secondary | ICD-10-CM | POA: Diagnosis not present

## 2015-08-14 DIAGNOSIS — Z87440 Personal history of urinary (tract) infections: Secondary | ICD-10-CM

## 2015-08-14 DIAGNOSIS — Z8742 Personal history of other diseases of the female genital tract: Secondary | ICD-10-CM

## 2015-08-14 LAB — POCT URINALYSIS DIPSTICK
Glucose, UA: NEGATIVE
LEUKOCYTES UA: NEGATIVE
NITRITE UA: NEGATIVE
PROTEIN UA: NEGATIVE

## 2015-08-14 NOTE — Progress Notes (Signed)
Subjective:     Patient ID: Chloe [name], female   DOB: 05/31/1986, 29 y.o.   MRN: [id number]  HPI Chloe [name] is a 29 year old white female,married back in follow up of having pain 9/8 has IUD and US showed right ovarian cyst and +CDS fluid, and had UTI.Feels much better but bleeding started next day after visit and she usually does not have a period.   Review of Systems Patient denies any headaches, hearing loss, fatigue, blurred vision, shortness of breath, chest pain, abdominal pain, problems with bowel movements, urination, or intercourse. No joint pain or mood swings. Reviewed past medical,surgical, social and family history. Reviewed medications and allergies.     Objective:   Physical Exam BP 110/78 mmHg  Pulse 92  Ht  (1.422 m)  Wt 125 lb (56.7 kg)  BMI 28.04 kg/m2  LMP 08/08/2015  Breastfeeding? Nourine 3+ blood, talk only, has no pain now and bleeding started the next day after appt.,told this was probably her period.   Face time 10 minutes.  Assessment:     History of ovarian cyst History of UTI    Plan:     Return in 4 weeks for follow up US on ovarian cyst   Call if needs anything

## 2015-08-14 NOTE — Patient Instructions (Signed)
Follow up in 4 week for gyn [place]

## 2015-09-11 ENCOUNTER — Encounter: Payer: Self-pay | Admitting: Obstetrics & Gynecology

## 2015-09-11 ENCOUNTER — Other Ambulatory Visit: Payer: BLUE CROSS/BLUE SHIELD

## 2015-09-11 ENCOUNTER — Other Ambulatory Visit: Payer: Self-pay | Admitting: Adult Health

## 2015-09-11 DIAGNOSIS — Z8742 Personal history of other diseases of the female genital tract: Secondary | ICD-10-CM

## 2016-04-15 ENCOUNTER — Encounter: Payer: Self-pay | Admitting: Advanced Practice Midwife

## 2016-04-15 ENCOUNTER — Other Ambulatory Visit (HOSPITAL_COMMUNITY)
Admission: RE | Admit: 2016-04-15 | Discharge: 2016-04-15 | Disposition: A | Discharge status: Home / Self Care | Payer: BLUE CROSS/BLUE SHIELD | Source: Ambulatory Visit | Attending: Advanced Practice Midwife | Admitting: Advanced Practice Midwife

## 2016-04-15 ENCOUNTER — Ambulatory Visit (INDEPENDENT_AMBULATORY_CARE_PROVIDER_SITE_OTHER): Payer: BLUE CROSS/BLUE SHIELD | Admitting: Advanced Practice Midwife

## 2016-04-15 VITALS — BP 110/80 | HR 84 | Ht <= 58 in | Wt 119.0 lb

## 2016-04-15 DIAGNOSIS — Z01411 Encounter for gynecological examination (general) (routine) with abnormal findings: Secondary | ICD-10-CM | POA: Diagnosis present

## 2016-04-15 DIAGNOSIS — N941 Unspecified dyspareunia: Secondary | ICD-10-CM | POA: Diagnosis not present

## 2016-04-15 DIAGNOSIS — N83299 Other ovarian cyst, unspecified side: Secondary | ICD-10-CM

## 2016-04-15 DIAGNOSIS — Z30432 Encounter for removal of intrauterine contraceptive device: Secondary | ICD-10-CM

## 2016-04-15 DIAGNOSIS — Z1151 Encounter for screening for human papillomavirus (HPV): Secondary | ICD-10-CM | POA: Insufficient documentation

## 2016-04-15 DIAGNOSIS — Z01419 Encounter for gynecological examination (general) (routine) without abnormal findings: Secondary | ICD-10-CM

## 2016-04-15 MED ORDER — NORGESTIMATE-ETH ESTRADIOL 0.25-35 MG-MCG PO TABS: 1.0000 | ORAL_TABLET | Freq: Every day | ORAL | Status: AC

## 2016-04-15 NOTE — Progress Notes (Signed)
Chloe [name] 29 y.o.  Filed Vitals:   04/15/16 1542  BP: 110/80  Pulse: 84     Filed Weights   04/15/16 1542  Weight: 119 lb (53.978 kg)    Past Medical History: Past Medical History  Diagnosis Date  . Thyroid disease   . Hypothyroidism   . Anxiety     Hx - no meds  . Heartburn during pregnancy   . HPV (human papilloma virus) anogenital infection   . Pelvic pain in female 08/07/2015  . UTI (lower urinary tract infection) 08/07/2015  . Cyst of ovary, right 08/07/2015    Past Surgical History: Past Surgical History  Procedure Laterality Date  . Cesarean section      [place]  . Tooth extraction    . Cesarean section N/A 11/27/2014    Procedure: REPEAT CESAREAN SECTION;  Surgeon: Lazaro Arms, MD;  Location: [facility];  Service: Obstetrics;  Laterality: N/A;    Family History: Family History  Problem Relation Age of Onset  . Diabetes Paternal Grandfather   . Diabetes Paternal Grandmother   . Epilepsy Father   . Breast cancer Mother   . Thyroid disease Mother     Social History: Social History  Substance Use Topics  . Smoking status: Current Every Day Smoker -- 0.25 packs/day for 8 years    Types: Cigarettes    Last Attempt to Quit: 05/29/2014  . Smokeless tobacco: Never Used  . Alcohol Use: 0.0 oz/week    0 Standard drinks or equivalent per week     Comment: occasional     Allergies:  Allergies  Allergen Reactions  . Amoxicillin Nausea And Vomiting  . Azithromycin Other (See Comments)    Abdominal pain.  . Cephalosporins Hives  . Penicillins Hives      Current outpatient prescriptions:  .  ibuprofen (ADVIL,MOTRIN) 800 MG tablet, Take 1 tablet (800 mg total) by mouth every 8 (eight) hours as needed., Disp: 60 tablet, Rfl: 1 .  levothyroxine (SYNTHROID, LEVOTHROID) 50 MCG tablet, Take 50 mcg by mouth daily before breakfast. , Disp: , Rfl: 0 .  norgestimate-ethinyl estradiol (ORTHO-CYCLEN,SPRINTEC,PREVIFEM) 0.25-35 MG-MCG tablet, Take 1 tablet by mouth  daily., Disp: 1 Package, Rfl: 11  History of Present Illness: here for IUD removal and repeat pap. Pap 2015 + HPV, 2016 normal.    Review of Systems   Patient denies any headaches, blurred vision, shortness of breath, chest pain, abdominal pain, problems with bowel movements, urination, or intercourse.   Physical Exam: General:  Well developed, well nourished, no acute distress Skin:  Warm and dry Neck:  Midline trachea, normal thyroid Lungs; Clear to auscultation bilaterally Breast:  No dominant palpable mass, retraction, or nipple discharge Cardiovascular: Regular rate and rhythm Abdomen:  Soft, non tender, no hepatosplenomegaly Pelvic:  External genitalia is normal in appearance.  The vagina is normal in appearance.  The cervix is bulbous.  Uterus is felt to be normal size, shape, and contour.  No adnexal masses or tenderness noted.  Extremities:  No swelling or varicosities noted Psych:  No mood changes.        IUD removal:   She had the Lyletta IUD placed a year ago and would like it removed d/t ovarian cysts, dysparunia. Her plans for future contraception are COCs.  A graves speculum was placed, and the strings were visible.  They were grasped with a curved Tresa Endo and the IUD easily removed.  Pt given IUD removal f/u instructions.   Impression: Normal GYN  exam IUD removal     Plan:  Meds ordered this encounter  Medications  . norgestimate-ethinyl estradiol (ORTHO-CYCLEN,SPRINTEC,PREVIFEM) 0.25-35 MG-MCG tablet    Sig: Take 1 tablet by mouth daily.    Dispense:  1 Package    Refill:  11    Order Specific Question:  Supervising Provider    Answer:  Duane LopeEURE, LUTHER H [[id number]]   BU 2 weeks If this pap is normal, Pap 2018, then can go to yearly

## 2016-04-15 NOTE — Addendum Note (Signed)
Addended by: Criss AlvinePULLIAM, CHRYSTAL G on: 04/15/2016 04:18 PM   Modules accepted: Orders

## 2016-04-19 LAB — CYTOLOGY - PAP

## 2016-04-21 ENCOUNTER — Encounter: Payer: Self-pay | Admitting: Advanced Practice Midwife

## 2016-04-21 DIAGNOSIS — R87810 Cervical high risk human papillomavirus (HPV) DNA test positive: Secondary | ICD-10-CM

## 2016-04-21 DIAGNOSIS — R87610 Atypical squamous cells of undetermined significance on cytologic smear of cervix (ASC-US): Secondary | ICD-10-CM | POA: Insufficient documentation

## 2016-04-27 ENCOUNTER — Telehealth: Payer: Self-pay | Admitting: Advanced Practice Midwife

## 2016-04-27 NOTE — Telephone Encounter (Signed)
Pt informed of abnormal pap with + HPV, colposcopy scheduled for 05/04/2016 with Dr. Despina HiddenEure. Procedure discussed with the pt and all questions answered. Pt verbalized understanding.

## 2016-05-04 ENCOUNTER — Ambulatory Visit (INDEPENDENT_AMBULATORY_CARE_PROVIDER_SITE_OTHER): Payer: BLUE CROSS/BLUE SHIELD | Admitting: Obstetrics & Gynecology

## 2016-05-04 ENCOUNTER — Encounter: Payer: Self-pay | Admitting: Obstetrics & Gynecology

## 2016-05-04 DIAGNOSIS — Z3202 Encounter for pregnancy test, result negative: Secondary | ICD-10-CM

## 2016-05-04 DIAGNOSIS — R896 Abnormal cytological findings in specimens from other organs, systems and tissues: Secondary | ICD-10-CM

## 2016-05-04 DIAGNOSIS — IMO0002 Reserved for concepts with insufficient information to code with codable children: Secondary | ICD-10-CM

## 2016-05-04 LAB — POCT URINE PREGNANCY: Preg Test, Ur: NEGATIVE

## 2016-05-04 NOTE — Progress Notes (Signed)
Patient ID: Grayling CongressMelissa A Voshell, female   DOB: 03/11/1986, 30 y.o.   MRN: [id number] Colposcopy Procedure Note:  Colposcopy Procedure Note  Indications: Pap smear 1 months ago showed: ASCUS with POSITIVE high risk HPV. The prior pap showed no abnormalities.  Prior cervical/vaginal disease: . Prior cervical treatment: no treatment.  Smoker:  Yes.   0.5 PPD New sexual partner:  No.  : time frame:    History of abnormal Pap: yes  Procedure Details  The risks and benefits of the procedure and Written informed consent obtained.  Speculum placed in vagina and excellent visualization of cervix achieved, cervix swabbed x 3 with acetic acid solution.  Findings: Cervix: no visible lesions and no mosaicism; no biopsies taken. Light AWE Vaginal inspection: normal without visible lesions. Vulvar colposcopy: vulvar colposcopy not performed.  Specimens: none  Complications: none.  Plan: Repeat Pap in 1 year

## 2017-01-20 ENCOUNTER — Encounter: Payer: Self-pay | Admitting: "Endocrinology

## 2017-01-20 ENCOUNTER — Ambulatory Visit (INDEPENDENT_AMBULATORY_CARE_PROVIDER_SITE_OTHER): Payer: BLUE CROSS/BLUE SHIELD | Admitting: "Endocrinology

## 2017-01-20 VITALS — BP 121/75 | HR 90 | Ht <= 58 in | Wt 134.0 lb

## 2017-01-20 DIAGNOSIS — E039 Hypothyroidism, unspecified: Secondary | ICD-10-CM

## 2017-01-20 LAB — TSH: TSH: 4.83 mIU/L — ABNORMAL HIGH

## 2017-01-20 LAB — T4, FREE: Free T4: 1 ng/dL (ref 0.8–1.8)

## 2017-01-20 LAB — T3, FREE: T3, Free: 3.9 pg/mL (ref 2.3–4.2)

## 2017-01-20 NOTE — Progress Notes (Signed)
Subjective:    Patient ID: Chloe [name], female    DOB: 04-Aug-1986, PCP Lawerance Sabal, [place]   Past Medical History:  Diagnosis Date  . Anxiety    Hx - no meds  . Cyst of ovary, right 08/07/2015  . Heartburn during pregnancy   . HPV (human papilloma virus) anogenital infection   . Hypothyroidism   . Pelvic pain in female 08/07/2015  . Thyroid disease   . UTI (lower urinary tract infection) 08/07/2015   Past Surgical History:  Procedure Laterality Date  . CESAREAN SECTION     [place]  . CESAREAN SECTION N/A 11/27/2014   Procedure: REPEAT CESAREAN SECTION;  Surgeon: Lazaro Arms, MD;  Location: [facility];  Service: Obstetrics;  Laterality: N/A;  . TOOTH EXTRACTION     Social History   Social History  . Marital status: Married    Spouse name: N/A  . Number of children: N/A  . Years of education: N/A   Social History Main Topics  . Smoking status: Current Every Day Smoker    Packs/day: 0.25    Years: 8.00    Types: Cigarettes    Last attempt to quit: 05/29/2014  . Smokeless tobacco: Never Used  . Alcohol use 0.0 oz/week     Comment: occasional   . Drug use: No  . Sexual activity: Yes   Other Topics Concern  . None   Social History Narrative  . None   Outpatient Encounter Prescriptions as of 01/20/2017  Medication Sig  . norgestimate-ethinyl estradiol (ORTHO-CYCLEN,SPRINTEC,PREVIFEM) 0.25-35 MG-MCG tablet Take 1 tablet by mouth daily.  . [DISCONTINUED] ibuprofen (ADVIL,MOTRIN) 800 MG tablet Take 1 tablet (800 mg total) by mouth every 8 (eight) hours as needed.  . [DISCONTINUED] levothyroxine (SYNTHROID, LEVOTHROID) 50 MCG tablet Take 50 mcg by mouth daily before breakfast.    No facility-administered encounter medications on file as of 01/20/2017.    ALLERGIES: Allergies  Allergen Reactions  . Amoxicillin Nausea And Vomiting  . Azithromycin Other (See Comments)    Abdominal pain.  . Cephalosporins Hives  . Penicillins Hives    VACCINATION  STATUS: Immunization History  Administered Date(s) Administered  . Influenza,inj,Quad PF,36+ Mos 11/28/2014    HPI 31 year old female patient with medical history as above. She is being seen in consultation for history of hypothyroidism requested by Lawerance Sabal, [place]. She reports that she was diagnosed with hypothyroidism in 2015 which required initiation of levothyroxine which was started at 25 g and slowly increased to 75 g by mouth every morning. She took it for only one year and took herself off of it. She has not taken any thyroid hormone replacement and more than a year. She did have partial thyroid function test in January 2018 which was unremarkable. see below. -She has family history of hypothyroidism in her mother and one of her grandmothers. -He complains of progressive weight gain, fatigue. She has long-standing trouble with tremors of upper extremities. She denies any personal history of goiter nor family history of thyroid cancer. She denies cold intolerance nor heat intolerance. -She denies dysphagia, shortness of breath, voice change. She denies any exposure to thyroid hormone products from the over-the-counter. She is a chronic heavy smoker.  Review of Systems  Constitutional: Progressive weight gain, + fatigue, no subjective hyperthermia, no subjective hypothermia Eyes: no blurry vision, no xerophthalmia ENT: no sore throat, no nodules palpated in throat, no dysphagia/odynophagia, no hoarseness Cardiovascular: no Chest Pain, no Shortness of Breath, no palpitations, no leg swelling  Respiratory: no cough, no SOB Gastrointestinal: no Nausea/Vomiting/Diarhhea Musculoskeletal: no muscle/joint aches Skin: no rashes Neurological: no tremors, no numbness, no tingling, no dizziness Psychiatric: no depression, no anxiety  Objective:    BP 121/75   Pulse 90   Ht 4\' 8"  (1.422 m)   Wt 134 lb (60.8 kg)   BMI 30.04 kg/m   Wt Readings from Last 3 Encounters:  01/20/17 134 lb  (60.8 kg)  04/15/16 119 lb (54 kg)  08/14/15 125 lb (56.7 kg)    Physical Exam  Constitutional: Significantly over weight for hight, not in acute distress, normal state of mind Eyes: PERRLA, EOMI, no exophthalmos ENT: moist mucous membranes, + palpable thyromegaly, no cervical lymphadenopathy Cardiovascular: normal precordial activity, Regular Rate and Rhythm, no Murmur/Rubs/Gallops Respiratory:  adequate breathing efforts, no gross chest deformity, Clear to auscultation bilaterally Gastrointestinal: abdomen soft, Non -tender, No distension, Bowel Sounds present Musculoskeletal: no gross deformities, strength intact in all four extremities Skin: moist, warm, no rashes Neurological: + tremor with outstretched hands (she says she always had this problem is long as he remembers), Deep tendon reflexes normal in all four extremities.  CMP ( most recent) CMP     Component Value Date/Time   NA 139 08/07/2015 1309   K 4.0 08/07/2015 1309   CL 101 08/07/2015 1309   CO2 22 08/07/2015 1309   GLUCOSE 79 08/07/2015 1309   BUN 11 08/07/2015 1309   CREATININE 0.57 08/07/2015 1309   CALCIUM 8.9 08/07/2015 1309   PROT 6.3 08/07/2015 1309   ALBUMIN 4.0 08/07/2015 1309   AST 15 08/07/2015 1309   ALT 13 08/07/2015 1309   ALKPHOS 60 08/07/2015 1309   BILITOT 0.4 08/07/2015 1309   GFRNONAA 127 08/07/2015 1309   GFRAA 147 08/07/2015 1309   On 12/17/2016 labs showed colon total T4 8 0.8 (normal 4.5-12), T3 uptake 19 (normal 24-39) disease, free thyroxine index 1.7 (normal 1 . 2-4.9)       Assessment & Plan:   1. Hypothyroidism, unspecified type - This patient is being seen at the kind request of her primary care provider VandaliaWorley, Tamera PuntMiranda, [place]. -I have reviewed her available thyroid workup records and clinically evaluated the patient. Although it appears that she did have transient hypothyroidism following the birth of her second child in  2015, she took herself off of thyroid hormone replacement  for more than a year and her recent thyroid function tests are unremarkable. - She is not getting any prescription today. She is not planning for anymore pregnancy in the near future. - I will proceed to obtain a new set of complete thyroid function tests including antithyroid antibodies. I will also proceed to obtain thyroid/neck sonogram given her clinical goiter. She will return in 10 days to discuss her results.  - I advised patient to maintain close follow up with Lawerance SabalWorley, Miranda, [place] for primary care needs. Follow up plan: Return in about 10 days (around 01/30/2017) for labs today, Thyroid / Neck Ultrasound.  Marquis LunchGebre Chadd Tollison, MD Phone: [phone]  Fax: [phone]   01/20/2017, 8:58 AM

## 2017-01-21 LAB — THYROID PEROXIDASE ANTIBODY: Thyroperoxidase Ab SerPl-aCnc: 377 IU/mL — ABNORMAL HIGH (ref <9)

## 2017-01-21 LAB — THYROGLOBULIN ANTIBODY: Thyroglobulin Ab: 3 IU/mL — ABNORMAL HIGH (ref <2)

## 2017-01-27 ENCOUNTER — Ambulatory Visit (HOSPITAL_COMMUNITY)
Admission: RE | Admit: 2017-01-27 | Discharge: 2017-01-27 | Disposition: A | Discharge status: Home / Self Care | Payer: BLUE CROSS/BLUE SHIELD | Source: Ambulatory Visit | Attending: "Endocrinology | Admitting: "Endocrinology

## 2017-01-27 ENCOUNTER — Ambulatory Visit (HOSPITAL_COMMUNITY): Admission: RE | Admit: 2017-01-27 | Payer: BLUE CROSS/BLUE SHIELD | Source: Ambulatory Visit

## 2017-01-27 DIAGNOSIS — E039 Hypothyroidism, unspecified: Secondary | ICD-10-CM | POA: Insufficient documentation

## 2017-01-27 DIAGNOSIS — E049 Nontoxic goiter, unspecified: Secondary | ICD-10-CM | POA: Insufficient documentation

## 2017-02-01 ENCOUNTER — Encounter: Payer: Self-pay | Admitting: "Endocrinology

## 2017-02-01 ENCOUNTER — Ambulatory Visit (INDEPENDENT_AMBULATORY_CARE_PROVIDER_SITE_OTHER): Payer: BLUE CROSS/BLUE SHIELD | Admitting: "Endocrinology

## 2017-02-01 VITALS — BP 127/85 | HR 100 | Ht <= 58 in | Wt 131.0 lb

## 2017-02-01 DIAGNOSIS — E063 Autoimmune thyroiditis: Secondary | ICD-10-CM | POA: Diagnosis not present

## 2017-02-01 DIAGNOSIS — E038 Other specified hypothyroidism: Secondary | ICD-10-CM

## 2017-02-01 MED ORDER — LEVOTHYROXINE SODIUM 50 MCG PO CAPS: 50.0000 ug | ORAL_CAPSULE | Freq: Every day | ORAL | 4 refills | Status: DC

## 2017-02-01 NOTE — Progress Notes (Signed)
Subjective:    Patient ID: Chloe [name], female    DOB: 26-Apr-1986, PCP Chloe Sabal, PA   Past Medical History:  Diagnosis Date  . Anxiety    Hx - no meds  . Cyst of ovary, right 08/07/2015  . Heartburn during pregnancy   . HPV (human papilloma virus) anogenital infection   . Hypothyroidism   . Pelvic pain in female 08/07/2015  . Thyroid disease   . UTI (lower urinary tract infection) 08/07/2015   Past Surgical History:  Procedure Laterality Date  . CESAREAN SECTION     [place]  . CESAREAN SECTION N/A 11/27/2014   Procedure: REPEAT CESAREAN SECTION;  Surgeon: Chloe Arms, MD;  Location: [facility];  Service: Obstetrics;  Laterality: N/A;  . TOOTH EXTRACTION     Social History   Social History  . Marital status: Married    Spouse name: N/A  . Number of children: N/A  . Years of education: N/A   Social History Main Topics  . Smoking status: Current Every Day Smoker    Packs/day: 0.25    Years: 8.00    Types: Cigarettes    Last attempt to quit: 05/29/2014  . Smokeless tobacco: Never Used  . Alcohol use 0.0 oz/week     Comment: occasional   . Drug use: No  . Sexual activity: Yes   Other Topics Concern  . None   Social History Narrative  . None   Outpatient Encounter Prescriptions as of 02/01/2017  Medication Sig  . Levothyroxine Sodium 50 MCG CAPS Take 1 capsule (50 mcg total) by mouth daily before breakfast.  . norgestimate-ethinyl estradiol (ORTHO-CYCLEN,SPRINTEC,PREVIFEM) 0.25-35 MG-MCG tablet Take 1 tablet by mouth daily.   No facility-administered encounter medications on file as of 02/01/2017.    ALLERGIES: Allergies  Allergen Reactions  . Amoxicillin Nausea And Vomiting  . Azithromycin Other (See Comments)    Abdominal pain.  . Cephalosporins Hives  . Penicillins Hives    VACCINATION STATUS: Immunization History  Administered Date(s) Administered  . Influenza,inj,Quad PF,36+ Mos 11/28/2014    HPI   31 year old female patient with medical  history as above. She is being seen in f/u for history of hypothyroidism requested by Chloe [name], [place]. She reports that she was diagnosed with hypothyroidism in 2015 which required initiation of levothyroxine which was started at 25 g and slowly increased to 75 g by mouth every morning. She took it for only one year and took herself off of it. She has not taken any thyroid hormone replacement and more than a year. She did have partial thyroid function test in January 2018 which was unremarkable. see below. - Her repeat thyroid function tests confirms hypothyroidism secondary to Hashimoto's thyroiditis. -She has family history of hypothyroidism in her mother and one of her grandmothers. -He complains of progressive weight gain, fatigue. She has long-standing trouble with tremors of upper extremities. She denies any personal history of goiter nor family history of thyroid cancer. She denies cold intolerance nor heat intolerance. -She denies dysphagia, shortness of breath, voice change. She denies any exposure to thyroid hormone products from the over-the-counter. She is a chronic heavy smoker.  Review of Systems  Constitutional: Progressive weight gain, + fatigue, no subjective hyperthermia, no subjective hypothermia Eyes: no blurry vision, no xerophthalmia ENT: no sore throat, no nodules palpated in throat, no dysphagia/odynophagia, no hoarseness Cardiovascular: no Chest Pain, no Shortness of Breath, no palpitations, no leg swelling Respiratory: no cough, no SOB Gastrointestinal: no Nausea/Vomiting/Diarhhea  Musculoskeletal: no muscle/joint aches Skin: no rashes Neurological: no tremors, no numbness, no tingling, no dizziness Psychiatric: no depression, no anxiety  Objective:    BP 127/85   Pulse 100   Ht 4\' 8"  (1.422 m)   Wt 131 lb (59.4 kg)   BMI 29.37 kg/m   Wt Readings from Last 3 Encounters:  02/01/17 131 lb (59.4 kg)  01/20/17 134 lb (60.8 kg)  04/15/16 119 lb (54 kg)     Physical Exam  Constitutional: Significantly over weight for hight, not in acute distress, normal state of mind Eyes: PERRLA, EOMI, no exophthalmos ENT: moist mucous membranes, + palpable thyromegaly, no cervical lymphadenopathy Cardiovascular: normal precordial activity, Regular Rate and Rhythm, no Murmur/Rubs/Gallops Respiratory:  adequate breathing efforts, no gross chest deformity, Clear to auscultation bilaterally Gastrointestinal: abdomen soft, Non -tender, No distension, Bowel Sounds present Musculoskeletal: no gross deformities, strength intact in all four extremities Skin: moist, warm, no rashes Neurological: + tremor with outstretched hands (she says she always had this problem is long as he remembers), Deep tendon reflexes normal in all four extremities.  CMP ( most recent) CMP     Component Value Date/Time   NA 139 08/07/2015 1309   K 4.0 08/07/2015 1309   CL 101 08/07/2015 1309   CO2 22 08/07/2015 1309   GLUCOSE 79 08/07/2015 1309   BUN 11 08/07/2015 1309   CREATININE 0.57 08/07/2015 1309   CALCIUM 8.9 08/07/2015 1309   PROT 6.3 08/07/2015 1309   ALBUMIN 4.0 08/07/2015 1309   AST 15 08/07/2015 1309   ALT 13 08/07/2015 1309   ALKPHOS 60 08/07/2015 1309   BILITOT 0.4 08/07/2015 1309   GFRNONAA 127 08/07/2015 1309   GFRAA 147 08/07/2015 1309  Results for [name], [name] (MRN [id number]) as of 02/01/2017 15:52  Ref. Range 01/20/2017 09:06  TSH Latest Units: mIU/L 4.83 (H)  Triiodothyronine,Free,Serum Latest Ref Range: 2.3 - 4.2 pg/mL 3.9  T4,Free(Direct) Latest Ref Range: 0.8 - 1.8 ng/dL 1.0  Thyroglobulin Ab Latest Ref Range: <2 IU/mL 3 (H)  Thyroperoxidase Ab SerPl-aCnc Latest Ref Range: <9 IU/mL 377 (H)     On 12/17/2016 labs showed colon total T4 8 0.8 (normal 4.5-12), T3 uptake 19 (normal 24-39) disease, free thyroxine index 1.7 (normal 1 . 2-4.9)    Her thyroid ultrasound is remarkable for heterogeneous echotexture with no discrete nodules.   Assessment &  Plan:   1. Hypothyroidism 2. Hashimoto's thyroiditis  Her interval workup confirms hypothyroidism secondary to Hashimoto's thyroiditis. Thyroid sonograms is remarkable for heterogeneous echotexture with no discrete nodules- no further intervention for now. I approached her with plan to start thyroid hormone replacement. She agrees with plan and I will proceed to initiate levothyroxine 50 g by mouth every morning. This dose would likely need to be adjusted based on her response.  - We discussed about correct intake of levothyroxine, at fasting, with water, separated by at least 30 minutes from breakfast, and separated by more than 4 hours from calcium, iron, multivitamins, acid reflux medications (PPIs). -Patient is made aware of the fact that thyroid hormone replacement is needed for life, dose to be adjusted by periodic monitoring of thyroid function tests.  - I advised patient to maintain close follow up with Chloe SabalWorley, Miranda, PA for primary care needs. Follow up plan: No Follow-up on file.  Marquis LunchGebre Chloe Weinrich, MD Phone: [phone]  Fax: [phone]   02/01/2017, 4:02 PM

## 2017-05-05 ENCOUNTER — Ambulatory Visit: Payer: BLUE CROSS/BLUE SHIELD | Admitting: "Endocrinology

## 2017-05-05 ENCOUNTER — Encounter: Payer: Self-pay | Admitting: "Endocrinology

## 2017-06-06 IMAGING — US US SOFT TISSUE HEAD/NECK
1 series · 14 of 25 positions shown · non-contrast
Comparison: None.

CLINICAL DATA: 30-year-old female with hypothyroidism

EXAM:
THYROID ULTRASOUND
TECHNIQUE: Ultrasound examination of the thyroid gland and adjacent soft
tissues was performed.

[Series 1: us soft tissue head/neck · 0.06mm/px · 14 of 59 slices shown]
[im 1/59]
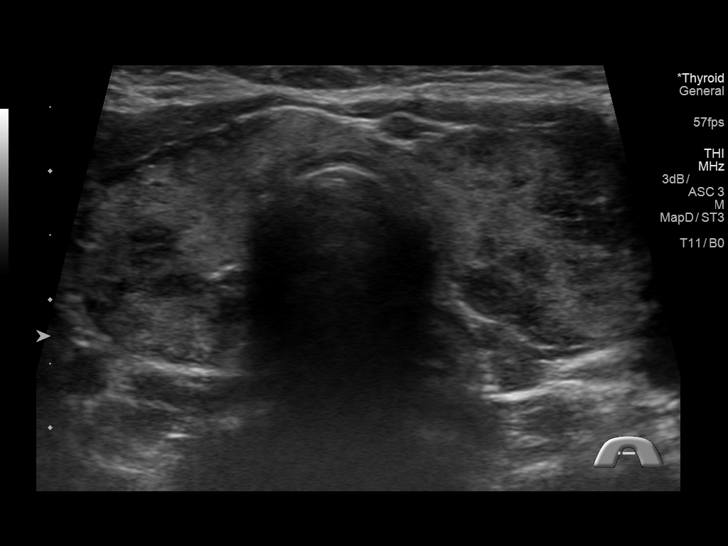
[im 5/59]
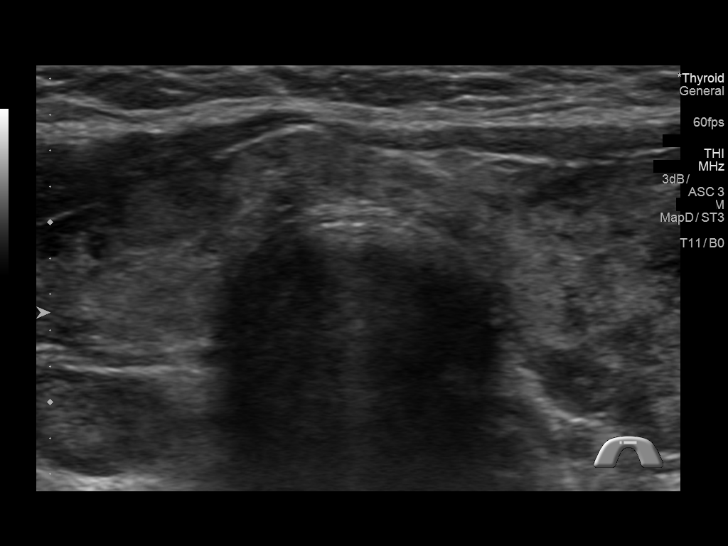
[im 10/59]
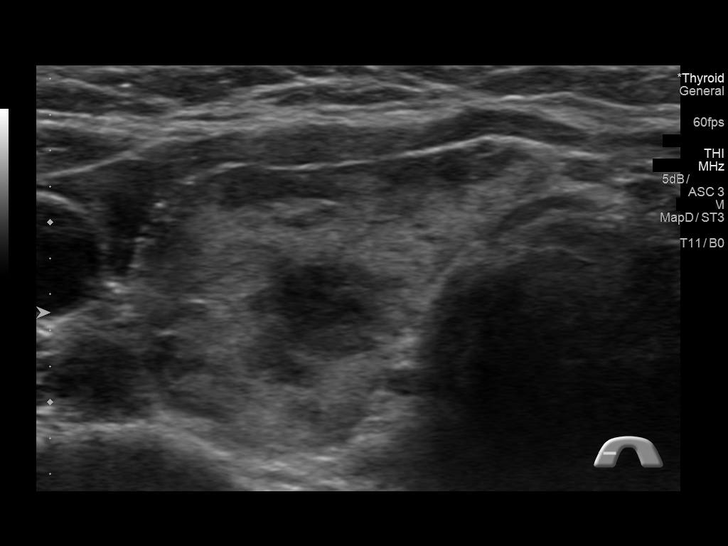
[im 15/59]
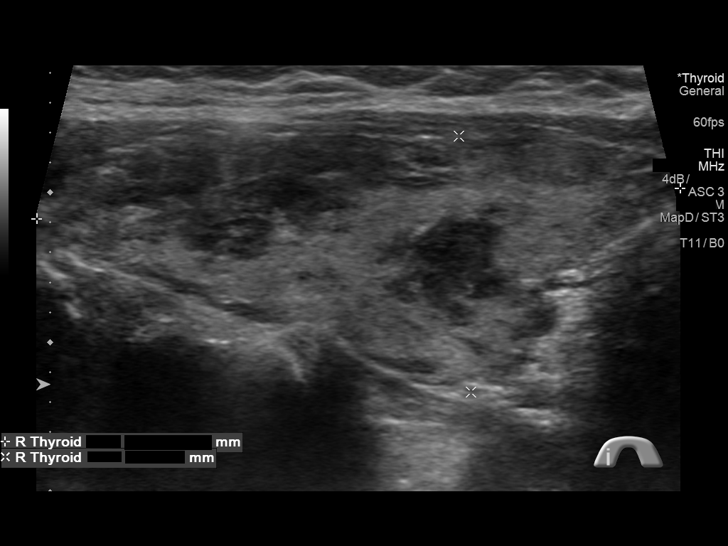
[im 20/59]
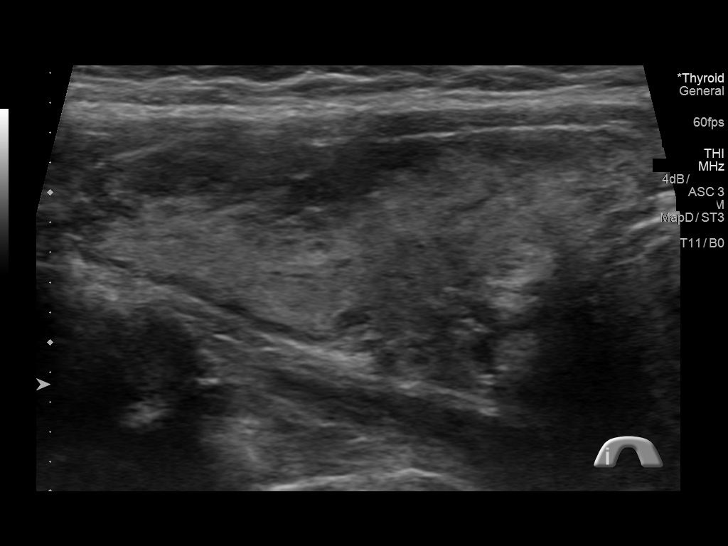
[im 22/59]
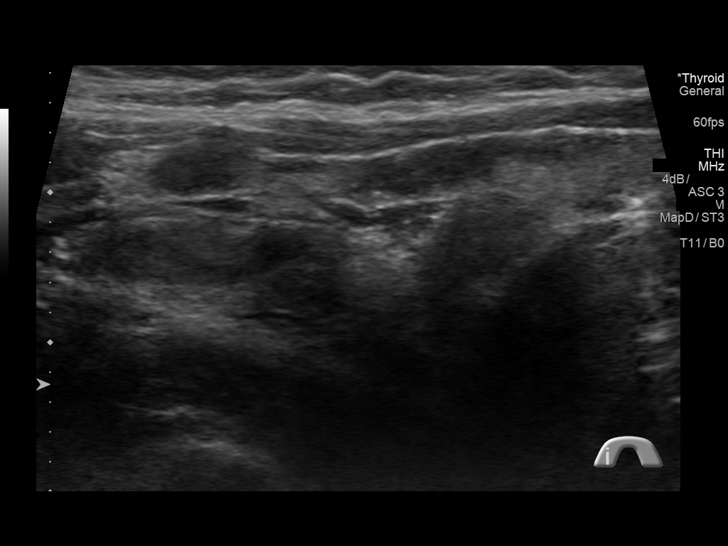
[im 27/59]
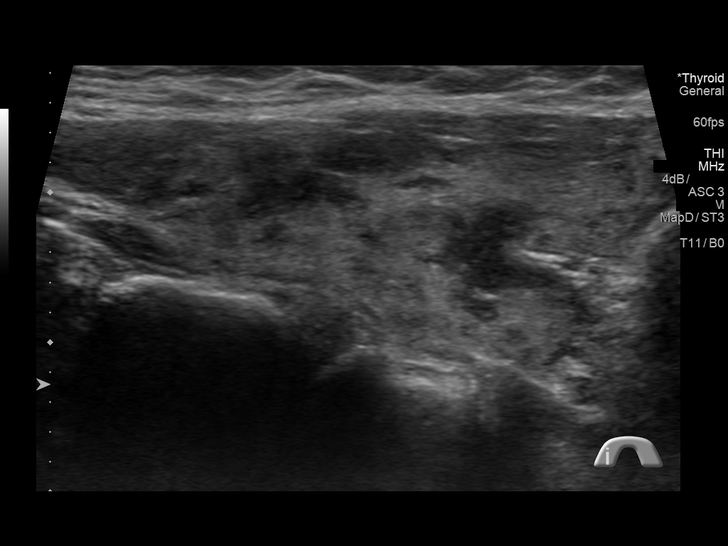
[im 32/59]
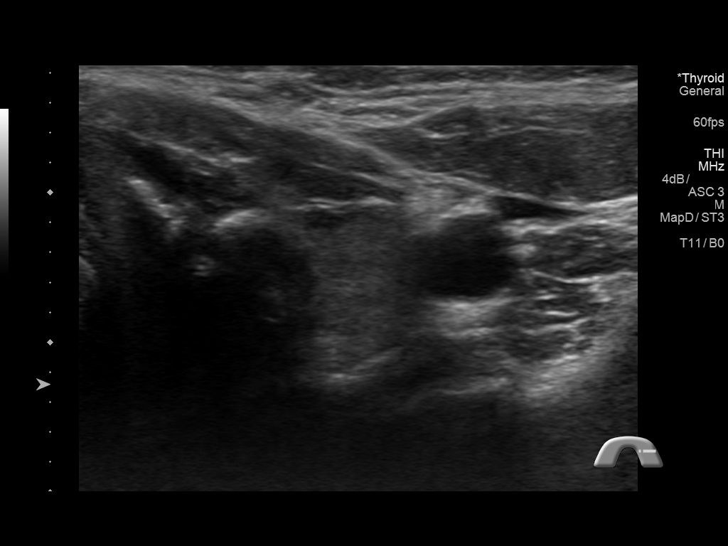
[im 37/59]
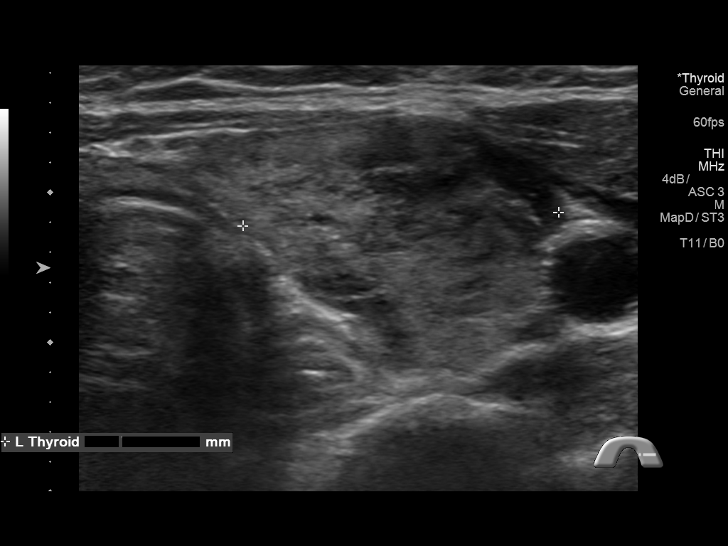
[im 39/59]
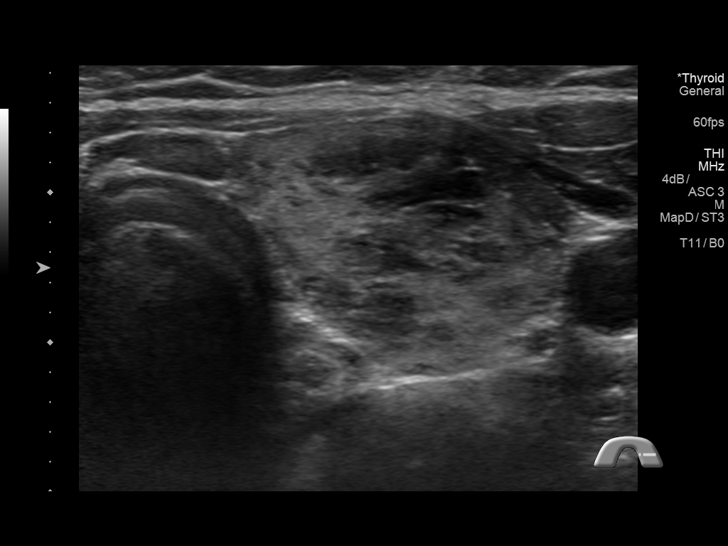
[im 44/59]
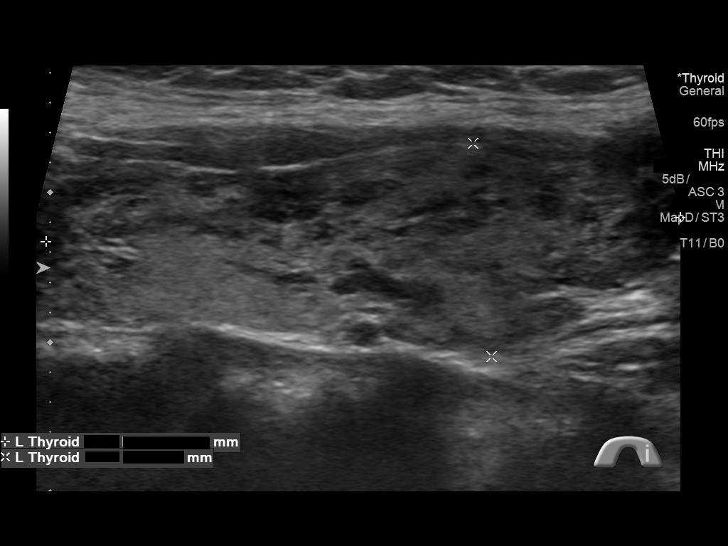
[im 49/59]
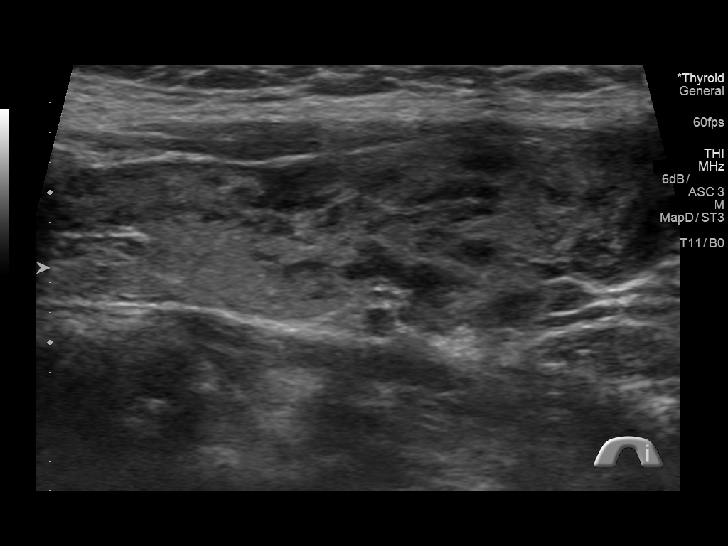
[im 54/59]
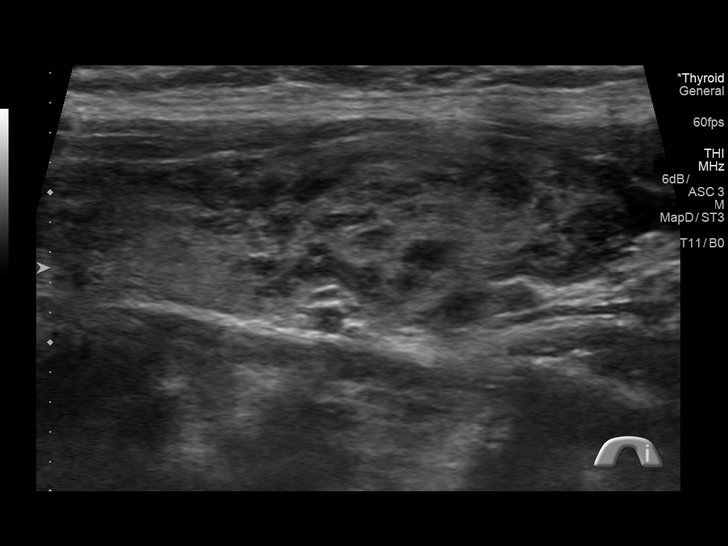
[im 59/59]
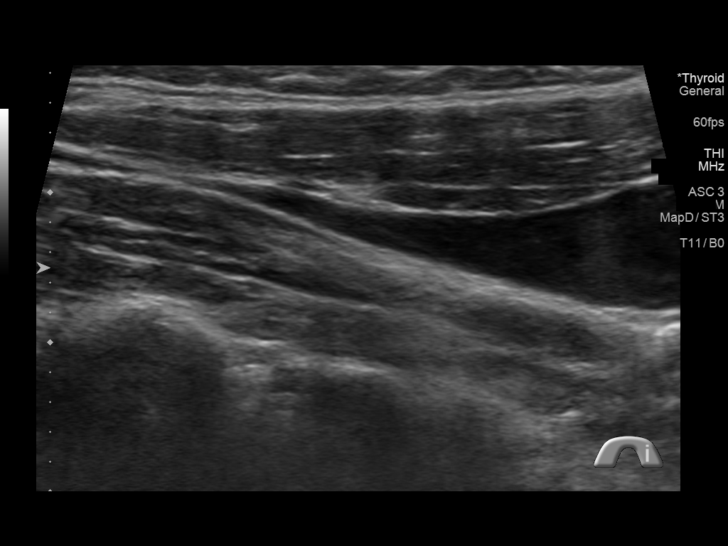

[14 of 25 positions shown; findings below may reference images not displayed]

FINDINGS: Parenchymal Echotexture: Markedly heterogenous

Isthmus: 0.4 cm

Right lobe: 4.3 x 1.7 x 1.9 cm

Left lobe: 4.2 x 1.4 x 2.1 cm

_________________________________________________________

Estimated total number of nodules >/= 1 cm: 0

Number of spongiform nodules >/=  2 cm not described below (TR1): 0

Number of mixed cystic and solid nodules >/= 1.5 cm not described
below (TR2): 0

No discrete nodules are seen within the thyroid gland.
IMPRESSION: Diffusely enlarged, heterogeneous and hypervascular thyroid gland.
In the setting of hypothyroidism, the sonographic appearance is most
suggestive of chronic thyroiditis.

No discrete nodules are identified.

## 2017-07-09 ENCOUNTER — Other Ambulatory Visit: Payer: Self-pay | Admitting: "Endocrinology

## 2017-07-14 ENCOUNTER — Other Ambulatory Visit: Payer: Self-pay

## 2017-07-14 MED ORDER — LEVOTHYROXINE SODIUM 50 MCG PO TABS: ORAL_TABLET | ORAL | 3 refills | Status: AC

## 2021-10-05 ENCOUNTER — Ambulatory Visit (HOSPITAL_COMMUNITY)
Admission: RE | Admit: 2021-10-05 | Discharge: 2021-10-05 | Disposition: A | Discharge status: Home / Self Care | Payer: Self-pay | Attending: Psychiatry | Admitting: Psychiatry

## 2021-10-05 NOTE — H&P (Signed)
Behavioral Health Medical Screening Exam  Chloe [name] is a 35 y.o. female is seen by this provider and TTS counselor as voluntary walk-in at [facility] accompanied by no one. History of BPD, MMD, PTSD, MDD. She moved from [place] one year ago and has not reconnected with psychiatric services since that move. She is sitting calmly in exam room, cooperative with interview.   Endorses vague suicidal ideation, without plan, without intent. Reports she was with a friend on a bridge and thought "this would be a good place". "Doesn't want to live, but doesn't want to die".  Denies homicidal ideation, denies auditory and visual hallucinations.   Lives with her three children, whom she describes as her protective factors. "My kids need a healthy mama".   Previous psychiatrist and therapist in [place] wants to get reconnected with psychiatric services in [place]. Wants to get back on medications. Has never been inpatient for psychiatric treatment.   See TTS counselor assessment for full psychiatric evaluation.   Total Time spent with patient: 30 minutes  Psychiatric Specialty Exam:  Presentation  General Appearance: Appropriate for Environment  Eye Contact:Good  Speech:Clear and Coherent; Normal Rate  Speech Volume:Normal  Handedness:No data recorded  Mood and Affect  Mood:Depressed  Affect:Tearful   Thought Process  Thought Processes:Coherent; Goal Directed  Descriptions of Associations:Intact  Orientation:Full (Time, Place and Person)  Thought Content:Logical  History of Schizophrenia/Schizoaffective disorder:No data recorded Duration of Psychotic Symptoms:No data recorded Hallucinations:Hallucinations: None  Ideas of Reference:None  Suicidal Thoughts:Suicidal Thoughts: Yes, Passive SI Passive Intent and/or Plan: Without Intent; Without Plan  Homicidal Thoughts:Homicidal Thoughts: No   Sensorium  Memory:Immediate Good; Recent Good; Remote  Good  Judgment:Good  Insight:Good   Executive Functions  Concentration:Good  Attention Span:Good  Recall:Good  Fund of Knowledge:Good  Language:Good   Psychomotor Activity  Psychomotor Activity:Psychomotor Activity: Normal   Assets  Assets:Communication Skills; Desire for Improvement; Housing; Leisure Time; Physical Health; Social Support; Transportation   Sleep  Sleep:Sleep: Fair Number of Hours of Sleep: 5    Physical Exam: Physical Exam Vitals reviewed.  Constitutional:      Appearance: Normal appearance.  Cardiovascular:     Rate and Rhythm: Tachycardia present.     Heart sounds: Normal heart sounds.  Pulmonary:     Effort: Pulmonary effort is normal.     Breath sounds: Normal breath sounds.  Musculoskeletal:        General: Normal range of motion.  Skin:    General: Skin is warm and dry.  Neurological:     Mental Status: She is alert and oriented to person, place, and time.  Psychiatric:        Attention and Perception: Attention normal.        Mood and Affect: Affect is tearful.        Speech: Speech normal.        Behavior: Behavior normal. Behavior is cooperative.        Thought Content: Thought content is not paranoid or delusional. Thought content includes suicidal (vague) ideation. Thought content does not include homicidal ideation. Thought content does not include homicidal or suicidal plan.        Cognition and Memory: Cognition normal.   Review of Systems  Constitutional:  Negative for chills and fever.  Respiratory:  Negative for shortness of breath.   Cardiovascular:  Negative for chest pain.  Gastrointestinal:  Negative for abdominal pain.  Neurological:  Negative for headaches.  Psychiatric/Behavioral:  Positive for suicidal ideas (no intent, no  plan). Negative for depression, hallucinations and substance abuse. The patient has insomnia (sleeps 5 hours per night). The patient is not nervous/anxious.   Pulse (!) 117, temperature 99.6  F (37.6 C), temperature source Oral, resp. rate 16, SpO2 98 %. There is no height or weight on file to calculate BMI.  Musculoskeletal: Strength & Muscle Tone: within normal limits Gait & Station: normal Patient leans: N/A   Recommendations:  Based on my evaluation the patient does not appear to have an emergency medical condition. This patient does not meet inpatient psychiatric treatment criteria. Safe for outpatient psychiatric treatment with [facility]. Agrees with plan. Safety planning reviewed. Information provided in written form per TTS counselor.   Novella Olive, NP 10/05/2021, 1:46 PM

## 2021-10-05 NOTE — BH Assessment (Signed)
Patient is a 35 year old female with a history significant for BPD, MDD and PTSD that presents this date requesting assistance with finding a mental health provider in [place]. Patient her husband and three children ages 65, 21 and 3 currently reside in [place] for the last year after relocating from [place] over one year ago. Patient states she had been receiving OP services at that time that assisted with medication management and counseling although that service was terminated once she relocated to [facility]. Patient states she briefly received counseling from [facility] in [place] although she lost her employment and those services were discontinued six months ago. Patient states since that date she and her husband has experienced multiple stressors to include job loss and family filing for bankruptcy. Patient reports in the last month she has been suffering from ongoing depression with symptoms to include: feeling hopeless and intermittent thoughts of self harm although patient states she would never act on them due to her 3 children and the support of her husband. Patient denies any SA issues. Patient denies access to weapons. Patient is contracting for safety after reporting passive S/I last week when she was with a friend near a bridge thinking "this would be a good place to jump off." Patient denies any prior attempts or gestures. Patient is contacting for safety and was discharged with resources in her area.    Dolby NP writes this date: MYKEL MOHL is a 35 y.o. female is seen by this provider and TTS counselor as voluntary walk-in at [facility] accompanied by no one. History of BPD, MMD, PTSD, MDD. She moved from [place] one year ago and has not reconnected with psychiatric services since that move. She is sitting calmly in exam room, cooperative with interview.    Endorses vague suicidal ideation, without plan, without intent. Reports she was with a friend on a bridge and thought "this would be a  good place". "Doesn't want to live, but doesn't want to die".  Denies homicidal ideation, denies auditory and visual hallucinations.    Lives with her three children, whom she describes as her protective factors. "My kids need a healthy mama".    Previous psychiatrist and therapist in [place] wants to get reconnected with psychiatric services in [place]. Wants to get back on medications. Has never been inpatient for psychiatric treatment.
